# Patient Record
Sex: Female | Born: 1955 | Race: White | Hispanic: No | Marital: Married | Smoking: Former smoker
Health system: Southern US, Community
[De-identification: ages and names within clinical notes are randomized; demographics above are authoritative.]

## PROBLEM LIST (undated history)

## (undated) DIAGNOSIS — A64 Unspecified sexually transmitted disease: Secondary | ICD-10-CM

## (undated) DIAGNOSIS — M797 Fibromyalgia: Secondary | ICD-10-CM

## (undated) DIAGNOSIS — IMO0001 Reserved for inherently not codable concepts without codable children: Secondary | ICD-10-CM

## (undated) DIAGNOSIS — K589 Irritable bowel syndrome without diarrhea: Secondary | ICD-10-CM

## (undated) DIAGNOSIS — N871 Moderate cervical dysplasia: Secondary | ICD-10-CM

## (undated) DIAGNOSIS — I10 Essential (primary) hypertension: Secondary | ICD-10-CM

## (undated) DIAGNOSIS — C20 Malignant neoplasm of rectum: Secondary | ICD-10-CM

## (undated) DIAGNOSIS — F329 Major depressive disorder, single episode, unspecified: Secondary | ICD-10-CM

## (undated) DIAGNOSIS — K219 Gastro-esophageal reflux disease without esophagitis: Secondary | ICD-10-CM

## (undated) DIAGNOSIS — M545 Low back pain, unspecified: Secondary | ICD-10-CM

## (undated) DIAGNOSIS — R51 Headache: Secondary | ICD-10-CM

## (undated) DIAGNOSIS — N951 Menopausal and female climacteric states: Secondary | ICD-10-CM

## (undated) DIAGNOSIS — S0300XA Dislocation of jaw, unspecified side, initial encounter: Secondary | ICD-10-CM

## (undated) DIAGNOSIS — F32A Depression, unspecified: Secondary | ICD-10-CM

## (undated) DIAGNOSIS — T7840XA Allergy, unspecified, initial encounter: Secondary | ICD-10-CM

## (undated) DIAGNOSIS — F419 Anxiety disorder, unspecified: Secondary | ICD-10-CM

## (undated) DIAGNOSIS — M1990 Unspecified osteoarthritis, unspecified site: Secondary | ICD-10-CM

## (undated) DIAGNOSIS — I341 Nonrheumatic mitral (valve) prolapse: Secondary | ICD-10-CM

## (undated) DIAGNOSIS — G629 Polyneuropathy, unspecified: Secondary | ICD-10-CM

## (undated) DIAGNOSIS — M899 Disorder of bone, unspecified: Secondary | ICD-10-CM

## (undated) DIAGNOSIS — Z5189 Encounter for other specified aftercare: Secondary | ICD-10-CM

## (undated) HISTORY — DX: Gastro-esophageal reflux disease without esophagitis: K21.9

## (undated) HISTORY — DX: Low back pain: M54.5

## (undated) HISTORY — DX: Dislocation of jaw, unspecified side, initial encounter: S03.00XA

## (undated) HISTORY — PX: COLONOSCOPY: SHX174

## (undated) HISTORY — DX: Fibromyalgia: M79.7

## (undated) HISTORY — DX: Menopausal and female climacteric states: N95.1

## (undated) HISTORY — PX: COLPOSCOPY: SHX161

## (undated) HISTORY — PX: HYSTEROSCOPY: SHX211

## (undated) HISTORY — DX: Disorder of bone, unspecified: M89.9

## (undated) HISTORY — DX: Encounter for other specified aftercare: Z51.89

## (undated) HISTORY — DX: Moderate cervical dysplasia: N87.1

## (undated) HISTORY — DX: Polyneuropathy, unspecified: G62.9

## (undated) HISTORY — PX: OTHER SURGICAL HISTORY: SHX169

## (undated) HISTORY — DX: Headache: R51

## (undated) HISTORY — DX: Reserved for inherently not codable concepts without codable children: IMO0001

## (undated) HISTORY — DX: Nonrheumatic mitral (valve) prolapse: I34.1

## (undated) HISTORY — DX: Malignant neoplasm of rectum: C20

## (undated) HISTORY — DX: Allergy, unspecified, initial encounter: T78.40XA

## (undated) HISTORY — PX: UPPER GASTROINTESTINAL ENDOSCOPY: SHX188

## (undated) HISTORY — DX: Irritable bowel syndrome, unspecified: K58.9

## (undated) HISTORY — DX: Anxiety disorder, unspecified: F41.9

## (undated) HISTORY — DX: Unspecified osteoarthritis, unspecified site: M19.90

## (undated) HISTORY — PX: BUNIONECTOMY: SHX129

## (undated) HISTORY — DX: Depression, unspecified: F32.A

## (undated) HISTORY — DX: Unspecified sexually transmitted disease: A64

## (undated) HISTORY — DX: Essential (primary) hypertension: I10

## (undated) HISTORY — DX: Low back pain, unspecified: M54.50

## (undated) HISTORY — DX: Major depressive disorder, single episode, unspecified: F32.9

---

## 1998-07-07 ENCOUNTER — Other Ambulatory Visit: Admission: RE | Admit: 1998-07-07 | Discharge: 1998-07-07 | Payer: Self-pay | Admitting: Obstetrics and Gynecology

## 1998-09-15 ENCOUNTER — Ambulatory Visit: Admission: RE | Admit: 1998-09-15 | Discharge: 1998-09-15 | Payer: Self-pay | Admitting: Family Medicine

## 1999-08-05 ENCOUNTER — Other Ambulatory Visit: Admission: RE | Admit: 1999-08-05 | Discharge: 1999-08-05 | Payer: Self-pay | Admitting: Obstetrics and Gynecology

## 1999-11-23 ENCOUNTER — Encounter: Payer: Self-pay | Admitting: Obstetrics and Gynecology

## 1999-11-23 ENCOUNTER — Encounter: Admission: RE | Admit: 1999-11-23 | Discharge: 1999-11-23 | Payer: Self-pay | Admitting: Obstetrics and Gynecology

## 2000-08-07 ENCOUNTER — Other Ambulatory Visit: Admission: RE | Admit: 2000-08-07 | Discharge: 2000-08-07 | Payer: Self-pay | Admitting: Obstetrics and Gynecology

## 2001-02-28 ENCOUNTER — Encounter: Admission: RE | Admit: 2001-02-28 | Discharge: 2001-02-28 | Payer: Self-pay | Admitting: Obstetrics and Gynecology

## 2001-02-28 ENCOUNTER — Encounter: Payer: Self-pay | Admitting: Obstetrics and Gynecology

## 2001-08-14 ENCOUNTER — Other Ambulatory Visit: Admission: RE | Admit: 2001-08-14 | Discharge: 2001-08-14 | Payer: Self-pay | Admitting: Obstetrics and Gynecology

## 2002-09-20 ENCOUNTER — Other Ambulatory Visit: Admission: RE | Admit: 2002-09-20 | Discharge: 2002-09-20 | Payer: Self-pay | Admitting: Obstetrics and Gynecology

## 2002-09-27 ENCOUNTER — Encounter: Payer: Self-pay | Admitting: Family Medicine

## 2002-09-27 ENCOUNTER — Encounter (INDEPENDENT_AMBULATORY_CARE_PROVIDER_SITE_OTHER): Payer: Self-pay | Admitting: *Deleted

## 2002-09-27 ENCOUNTER — Encounter: Admission: RE | Admit: 2002-09-27 | Discharge: 2002-09-27 | Payer: Self-pay | Admitting: Family Medicine

## 2002-11-26 ENCOUNTER — Encounter: Payer: Self-pay | Admitting: Obstetrics and Gynecology

## 2002-11-26 ENCOUNTER — Encounter: Admission: RE | Admit: 2002-11-26 | Discharge: 2002-11-26 | Payer: Self-pay | Admitting: Obstetrics and Gynecology

## 2003-01-06 ENCOUNTER — Encounter: Payer: Self-pay | Admitting: Internal Medicine

## 2003-01-06 LAB — HM COLONOSCOPY

## 2003-04-23 ENCOUNTER — Encounter: Admission: RE | Admit: 2003-04-23 | Discharge: 2003-04-23 | Payer: Self-pay | Admitting: Family Medicine

## 2003-09-22 ENCOUNTER — Other Ambulatory Visit: Admission: RE | Admit: 2003-09-22 | Discharge: 2003-09-22 | Payer: Self-pay | Admitting: Obstetrics and Gynecology

## 2004-02-24 ENCOUNTER — Encounter: Admission: RE | Admit: 2004-02-24 | Discharge: 2004-02-24 | Payer: Self-pay | Admitting: Obstetrics and Gynecology

## 2004-09-03 ENCOUNTER — Ambulatory Visit: Payer: Self-pay | Admitting: Family Medicine

## 2004-09-23 ENCOUNTER — Other Ambulatory Visit: Admission: RE | Admit: 2004-09-23 | Discharge: 2004-09-23 | Payer: Self-pay | Admitting: Obstetrics and Gynecology

## 2004-11-02 ENCOUNTER — Ambulatory Visit: Payer: Self-pay | Admitting: Family Medicine

## 2004-11-09 ENCOUNTER — Ambulatory Visit: Payer: Self-pay | Admitting: Family Medicine

## 2005-09-26 ENCOUNTER — Other Ambulatory Visit: Admission: RE | Admit: 2005-09-26 | Discharge: 2005-09-26 | Payer: Self-pay | Admitting: Obstetrics and Gynecology

## 2005-10-04 ENCOUNTER — Encounter: Admission: RE | Admit: 2005-10-04 | Discharge: 2005-10-04 | Payer: Self-pay | Admitting: Obstetrics and Gynecology

## 2006-01-19 ENCOUNTER — Ambulatory Visit: Payer: Self-pay | Admitting: Family Medicine

## 2006-01-23 ENCOUNTER — Ambulatory Visit: Payer: Self-pay | Admitting: Family Medicine

## 2006-07-25 ENCOUNTER — Encounter: Admission: RE | Admit: 2006-07-25 | Discharge: 2006-07-25 | Payer: Self-pay | Admitting: Rheumatology

## 2006-08-17 ENCOUNTER — Encounter: Admission: RE | Admit: 2006-08-17 | Discharge: 2006-08-17 | Payer: Self-pay | Admitting: Neurosurgery

## 2006-09-28 ENCOUNTER — Other Ambulatory Visit: Admission: RE | Admit: 2006-09-28 | Discharge: 2006-09-28 | Payer: Self-pay | Admitting: Obstetrics and Gynecology

## 2006-10-31 ENCOUNTER — Encounter: Admission: RE | Admit: 2006-10-31 | Discharge: 2006-10-31 | Payer: Self-pay | Admitting: Obstetrics and Gynecology

## 2007-02-26 ENCOUNTER — Ambulatory Visit: Payer: Self-pay | Admitting: Family Medicine

## 2007-02-26 LAB — CONVERTED CEMR LAB
ALT: 18 units/L (ref 0–35)
AST: 19 units/L (ref 0–37)
Albumin: 3.7 g/dL (ref 3.5–5.2)
Alkaline Phosphatase: 25 units/L — ABNORMAL LOW (ref 39–117)
BUN: 9 mg/dL (ref 6–23)
Basophils Absolute: 0.1 10*3/uL (ref 0.0–0.1)
Blood in Urine, dipstick: NEGATIVE
Calcium: 8.7 mg/dL (ref 8.4–10.5)
Chloride: 105 meq/L (ref 96–112)
Direct LDL: 106.5 mg/dL
Eosinophils Absolute: 0.2 10*3/uL (ref 0.0–0.6)
GFR calc non Af Amer: 113 mL/min
Glucose, Urine, Semiquant: NEGATIVE
HDL: 66.8 mg/dL (ref >39.0)
Ketones, urine, test strip: NEGATIVE
MCHC: 33.6 g/dL (ref 30.0–36.0)
MCV: 90.4 fL (ref 78.0–100.0)
Monocytes Relative: 6.5 % (ref 3.0–11.0)
Platelets: 249 10*3/uL (ref 150–400)
RBC: 3.78 M/uL — ABNORMAL LOW (ref 3.87–5.11)
RDW: 11.8 % (ref 11.5–14.6)
Sodium: 141 meq/L (ref 135–145)
Specific Gravity, Urine: 1.015
Total CHOL/HDL Ratio: 3
Triglycerides: 131 mg/dL (ref 0–149)
pH: 8.5

## 2007-03-05 ENCOUNTER — Ambulatory Visit: Payer: Self-pay | Admitting: Family Medicine

## 2007-03-05 DIAGNOSIS — R51 Headache: Secondary | ICD-10-CM | POA: Insufficient documentation

## 2007-03-05 DIAGNOSIS — IMO0001 Reserved for inherently not codable concepts without codable children: Secondary | ICD-10-CM | POA: Insufficient documentation

## 2007-03-05 DIAGNOSIS — M545 Low back pain: Secondary | ICD-10-CM

## 2007-03-05 DIAGNOSIS — I10 Essential (primary) hypertension: Secondary | ICD-10-CM | POA: Insufficient documentation

## 2007-03-05 DIAGNOSIS — F329 Major depressive disorder, single episode, unspecified: Secondary | ICD-10-CM

## 2007-03-05 DIAGNOSIS — R519 Headache, unspecified: Secondary | ICD-10-CM | POA: Insufficient documentation

## 2007-03-05 DIAGNOSIS — K219 Gastro-esophageal reflux disease without esophagitis: Secondary | ICD-10-CM

## 2007-03-05 DIAGNOSIS — J309 Allergic rhinitis, unspecified: Secondary | ICD-10-CM | POA: Insufficient documentation

## 2007-07-19 ENCOUNTER — Telehealth (INDEPENDENT_AMBULATORY_CARE_PROVIDER_SITE_OTHER): Payer: Self-pay | Admitting: *Deleted

## 2007-08-07 ENCOUNTER — Ambulatory Visit: Payer: Self-pay | Admitting: Internal Medicine

## 2007-08-21 ENCOUNTER — Ambulatory Visit: Payer: Self-pay | Admitting: Internal Medicine

## 2007-08-21 ENCOUNTER — Encounter: Payer: Self-pay | Admitting: Internal Medicine

## 2007-08-21 ENCOUNTER — Encounter: Payer: Self-pay | Admitting: Family Medicine

## 2007-09-27 ENCOUNTER — Encounter: Payer: Self-pay | Admitting: Internal Medicine

## 2007-10-01 ENCOUNTER — Other Ambulatory Visit: Admission: RE | Admit: 2007-10-01 | Discharge: 2007-10-01 | Payer: Self-pay | Admitting: Obstetrics and Gynecology

## 2007-11-01 ENCOUNTER — Encounter: Admission: RE | Admit: 2007-11-01 | Discharge: 2007-11-01 | Payer: Self-pay | Admitting: Obstetrics and Gynecology

## 2007-11-15 ENCOUNTER — Encounter (INDEPENDENT_AMBULATORY_CARE_PROVIDER_SITE_OTHER): Payer: Self-pay | Admitting: Surgery

## 2007-11-15 ENCOUNTER — Ambulatory Visit (HOSPITAL_COMMUNITY): Admission: RE | Admit: 2007-11-15 | Discharge: 2007-11-15 | Payer: Self-pay | Admitting: Surgery

## 2007-11-15 ENCOUNTER — Encounter (INDEPENDENT_AMBULATORY_CARE_PROVIDER_SITE_OTHER): Payer: Self-pay | Admitting: *Deleted

## 2007-11-23 ENCOUNTER — Ambulatory Visit: Payer: Self-pay | Admitting: Oncology

## 2007-11-26 ENCOUNTER — Telehealth: Payer: Self-pay | Admitting: Internal Medicine

## 2007-11-28 ENCOUNTER — Encounter: Payer: Self-pay | Admitting: Internal Medicine

## 2007-11-28 LAB — CBC WITH DIFFERENTIAL/PLATELET
BASO%: 0.9 % (ref 0.0–2.0)
EOS%: 1.8 % (ref 0.0–7.0)
HCT: 34.7 % — ABNORMAL LOW (ref 34.8–46.6)
LYMPH%: 35.1 % (ref 14.0–48.0)
MCH: 30.1 pg (ref 26.0–34.0)
MCHC: 35.4 g/dL (ref 32.0–36.0)
MONO#: 0.3 10*3/uL (ref 0.1–0.9)
NEUT%: 56.5 % (ref 39.6–76.8)
Platelets: 324 10*3/uL (ref 145–400)
RBC: 4.08 10*6/uL (ref 3.70–5.32)
WBC: 6.2 10*3/uL (ref 3.9–10.0)

## 2007-11-28 LAB — COMPREHENSIVE METABOLIC PANEL
AST: 12 U/L (ref 0–37)
Alkaline Phosphatase: 32 U/L — ABNORMAL LOW (ref 39–117)
BUN: 13 mg/dL (ref 6–23)
Creatinine, Ser: 0.89 mg/dL (ref 0.40–1.20)

## 2007-12-03 ENCOUNTER — Ambulatory Visit: Admission: RE | Admit: 2007-12-03 | Discharge: 2008-01-28 | Payer: Self-pay | Admitting: Radiation Oncology

## 2007-12-04 ENCOUNTER — Ambulatory Visit (HOSPITAL_COMMUNITY): Admission: RE | Admit: 2007-12-04 | Discharge: 2007-12-04 | Payer: Self-pay | Admitting: Oncology

## 2007-12-04 ENCOUNTER — Encounter (INDEPENDENT_AMBULATORY_CARE_PROVIDER_SITE_OTHER): Payer: Self-pay | Admitting: *Deleted

## 2007-12-05 ENCOUNTER — Encounter: Payer: Self-pay | Admitting: Internal Medicine

## 2007-12-07 ENCOUNTER — Encounter (INDEPENDENT_AMBULATORY_CARE_PROVIDER_SITE_OTHER): Payer: Self-pay | Admitting: Diagnostic Radiology

## 2007-12-07 ENCOUNTER — Ambulatory Visit (HOSPITAL_COMMUNITY): Admission: RE | Admit: 2007-12-07 | Discharge: 2007-12-07 | Payer: Self-pay | Admitting: Oncology

## 2007-12-07 ENCOUNTER — Encounter (INDEPENDENT_AMBULATORY_CARE_PROVIDER_SITE_OTHER): Payer: Self-pay | Admitting: *Deleted

## 2008-03-11 ENCOUNTER — Encounter: Payer: Self-pay | Admitting: Cardiology

## 2008-03-20 ENCOUNTER — Ambulatory Visit: Payer: Self-pay | Admitting: Oncology

## 2008-03-20 LAB — CBC WITH DIFFERENTIAL/PLATELET
BASO%: 0.2 % (ref 0.0–2.0)
EOS%: 4.7 % (ref 0.0–7.0)
MCH: 31.7 pg (ref 26.0–34.0)
MCHC: 35 g/dL (ref 32.0–36.0)
MONO#: 0.4 10*3/uL (ref 0.1–0.9)
RBC: 3.35 10*6/uL — ABNORMAL LOW (ref 3.70–5.32)
WBC: 4.1 10*3/uL (ref 3.9–10.0)
lymph#: 1.1 10*3/uL (ref 0.9–3.3)

## 2008-03-20 LAB — COMPREHENSIVE METABOLIC PANEL
ALT: 24 U/L (ref 0–35)
AST: 16 U/L (ref 0–37)
CO2: 23 mEq/L (ref 19–32)
Calcium: 8.3 mg/dL — ABNORMAL LOW (ref 8.4–10.5)
Chloride: 108 mEq/L (ref 96–112)
Creatinine, Ser: 0.7 mg/dL (ref 0.40–1.20)
Sodium: 141 mEq/L (ref 135–145)
Total Bilirubin: 0.3 mg/dL (ref 0.3–1.2)
Total Protein: 5.7 g/dL — ABNORMAL LOW (ref 6.0–8.3)

## 2008-04-01 ENCOUNTER — Telehealth: Payer: Self-pay | Admitting: Family Medicine

## 2008-05-12 ENCOUNTER — Ambulatory Visit: Payer: Self-pay | Admitting: Family Medicine

## 2008-05-12 LAB — CONVERTED CEMR LAB
Basophils Absolute: 0 10*3/uL (ref 0.0–0.1)
Bilirubin Urine: NEGATIVE
Bilirubin, Direct: 0.1 mg/dL (ref 0.0–0.3)
Blood in Urine, dipstick: NEGATIVE
Calcium: 8.7 mg/dL (ref 8.4–10.5)
Cholesterol: 194 mg/dL (ref 0–200)
GFR calc Af Amer: 114 mL/min
HCT: 36.8 % (ref 36.0–46.0)
Hemoglobin: 12.9 g/dL (ref 12.0–15.0)
Ketones, urine, test strip: NEGATIVE
LDL Cholesterol: 122 mg/dL — ABNORMAL HIGH (ref 0–99)
MCHC: 35 g/dL (ref 30.0–36.0)
Monocytes Absolute: 0.3 10*3/uL (ref 0.1–1.0)
Neutro Abs: 2.5 10*3/uL (ref 1.4–7.7)
RDW: 10.9 % — ABNORMAL LOW (ref 11.5–14.6)
Sodium: 142 meq/L (ref 135–145)
Total Bilirubin: 0.6 mg/dL (ref 0.3–1.2)
Triglycerides: 84 mg/dL (ref 0–149)
Urobilinogen, UA: 0.2

## 2008-05-16 HISTORY — PX: CERVICAL CONE BIOPSY: SUR198

## 2008-05-22 ENCOUNTER — Ambulatory Visit: Payer: Self-pay | Admitting: Family Medicine

## 2008-05-22 DIAGNOSIS — C20 Malignant neoplasm of rectum: Secondary | ICD-10-CM | POA: Insufficient documentation

## 2008-06-12 ENCOUNTER — Telehealth: Payer: Self-pay | Admitting: *Deleted

## 2008-07-15 ENCOUNTER — Telehealth: Payer: Self-pay | Admitting: Family Medicine

## 2008-07-16 ENCOUNTER — Ambulatory Visit: Payer: Self-pay | Admitting: Family Medicine

## 2008-07-16 DIAGNOSIS — M26609 Unspecified temporomandibular joint disorder, unspecified side: Secondary | ICD-10-CM | POA: Insufficient documentation

## 2008-10-02 ENCOUNTER — Ambulatory Visit: Payer: Self-pay | Admitting: Obstetrics and Gynecology

## 2008-10-02 ENCOUNTER — Encounter: Payer: Self-pay | Admitting: Obstetrics and Gynecology

## 2008-10-02 ENCOUNTER — Other Ambulatory Visit: Admission: RE | Admit: 2008-10-02 | Discharge: 2008-10-02 | Payer: Self-pay | Admitting: Obstetrics and Gynecology

## 2008-10-07 ENCOUNTER — Encounter: Payer: Self-pay | Admitting: Internal Medicine

## 2008-11-06 ENCOUNTER — Encounter: Admission: RE | Admit: 2008-11-06 | Discharge: 2008-11-06 | Payer: Self-pay | Admitting: Obstetrics and Gynecology

## 2008-11-10 ENCOUNTER — Encounter (INDEPENDENT_AMBULATORY_CARE_PROVIDER_SITE_OTHER): Payer: Self-pay | Admitting: *Deleted

## 2008-11-10 ENCOUNTER — Ambulatory Visit: Payer: Self-pay | Admitting: Obstetrics and Gynecology

## 2008-11-12 ENCOUNTER — Telehealth: Payer: Self-pay | Admitting: Internal Medicine

## 2008-11-13 DIAGNOSIS — K209 Esophagitis, unspecified without bleeding: Secondary | ICD-10-CM | POA: Insufficient documentation

## 2008-11-13 DIAGNOSIS — M1990 Unspecified osteoarthritis, unspecified site: Secondary | ICD-10-CM | POA: Insufficient documentation

## 2008-11-13 DIAGNOSIS — Z860100 Personal history of colon polyps, unspecified: Secondary | ICD-10-CM | POA: Insufficient documentation

## 2008-11-13 DIAGNOSIS — K649 Unspecified hemorrhoids: Secondary | ICD-10-CM | POA: Insufficient documentation

## 2008-11-13 DIAGNOSIS — Z87898 Personal history of other specified conditions: Secondary | ICD-10-CM

## 2008-11-13 DIAGNOSIS — C210 Malignant neoplasm of anus, unspecified: Secondary | ICD-10-CM | POA: Insufficient documentation

## 2008-11-18 ENCOUNTER — Ambulatory Visit: Payer: Self-pay | Admitting: Internal Medicine

## 2008-11-18 DIAGNOSIS — R197 Diarrhea, unspecified: Secondary | ICD-10-CM

## 2008-11-20 LAB — CONVERTED CEMR LAB: Tissue Transglutaminase Ab, IgA: 0 units (ref <7)

## 2008-11-24 ENCOUNTER — Ambulatory Visit: Payer: Self-pay | Admitting: Obstetrics and Gynecology

## 2008-11-24 ENCOUNTER — Encounter: Payer: Self-pay | Admitting: Obstetrics and Gynecology

## 2008-12-11 ENCOUNTER — Ambulatory Visit: Payer: Self-pay | Admitting: Obstetrics and Gynecology

## 2008-12-18 ENCOUNTER — Encounter: Payer: Self-pay | Admitting: Obstetrics and Gynecology

## 2008-12-18 ENCOUNTER — Ambulatory Visit (HOSPITAL_BASED_OUTPATIENT_CLINIC_OR_DEPARTMENT_OTHER): Admission: RE | Admit: 2008-12-18 | Discharge: 2008-12-18 | Payer: Self-pay | Admitting: Obstetrics and Gynecology

## 2008-12-19 ENCOUNTER — Telehealth: Payer: Self-pay | Admitting: Family Medicine

## 2009-01-14 ENCOUNTER — Ambulatory Visit: Payer: Self-pay | Admitting: Obstetrics and Gynecology

## 2009-01-15 IMAGING — CT CT BIOPSY
1 series · 15 of 32 positions shown, 19 images · non-contrast
Comparison: none

CLINICAL HISTORY: History of anal cancer with a small PET positive
lymph node in the left external iliac chain.

[Series 2: routine pelvis · axial · 0.70mm/px · z∈[-240,-175]mm · 15 of 128 slices shown, 19 images]
[im 9/128  soft-tissue]
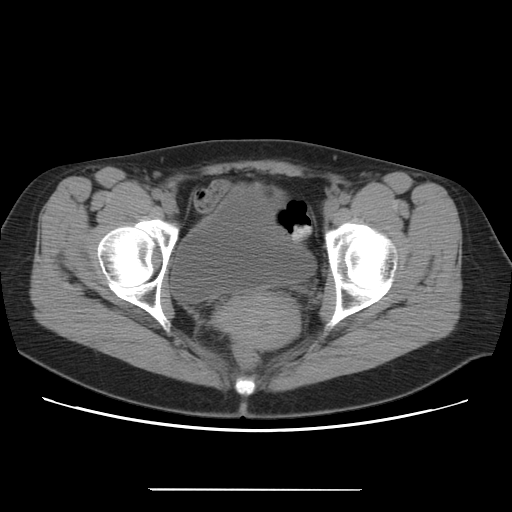
[im 9/128  bone]
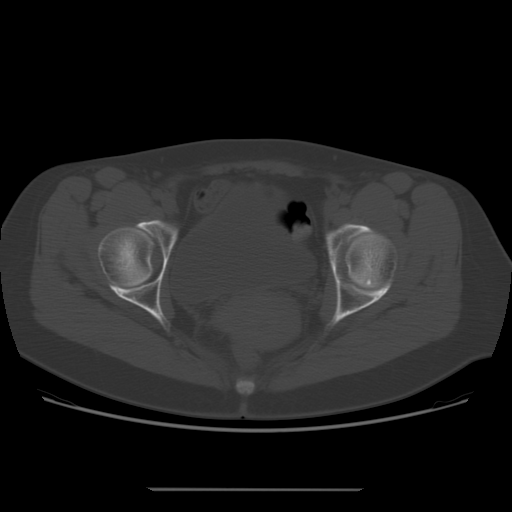
[im 17/128  soft-tissue]
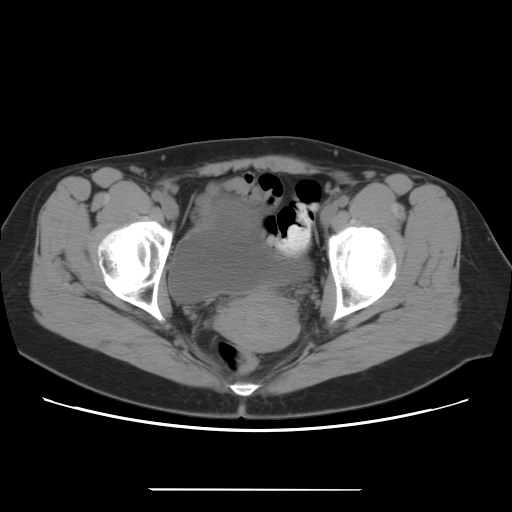
[im 25/128  soft-tissue]
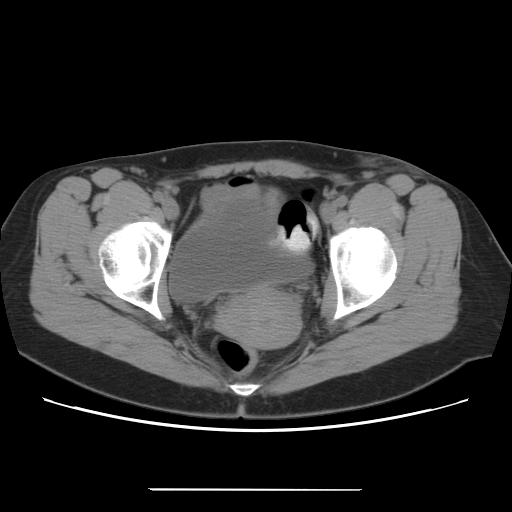
[im 37/128  soft-tissue]
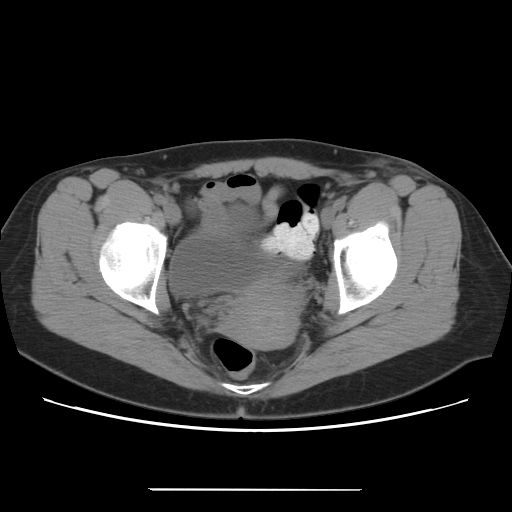
[im 46/128  soft-tissue]
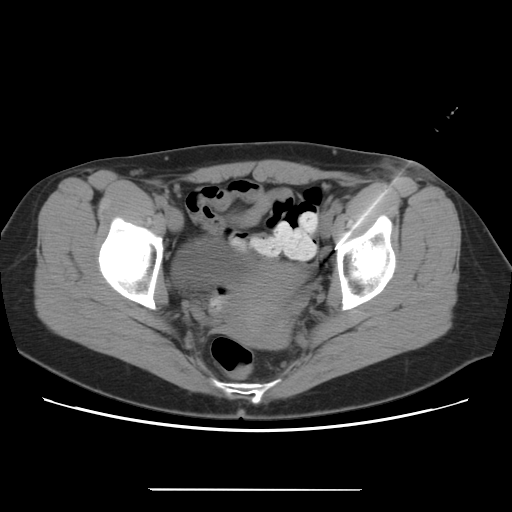
[im 54/128  soft-tissue]
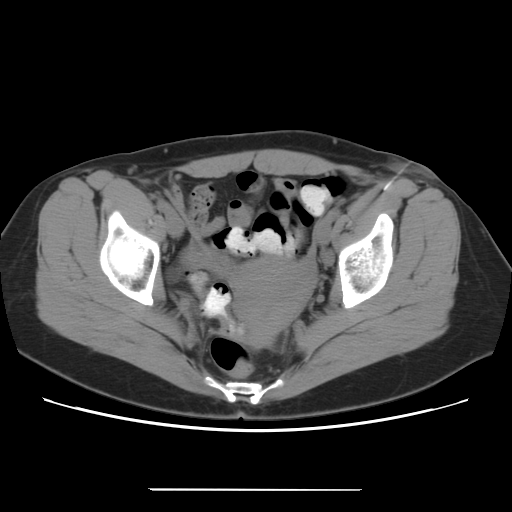
[im 66/128  soft-tissue]
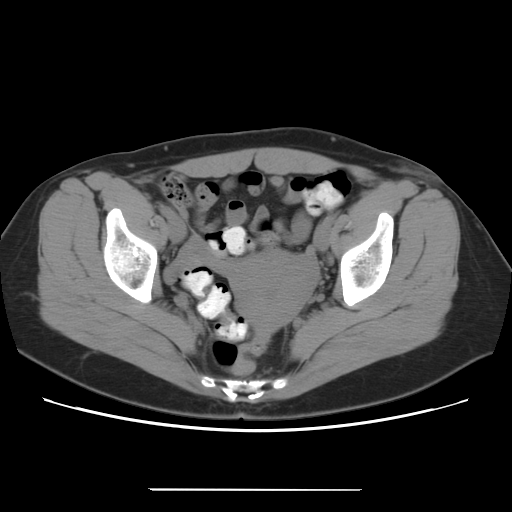
[im 74/128  soft-tissue]
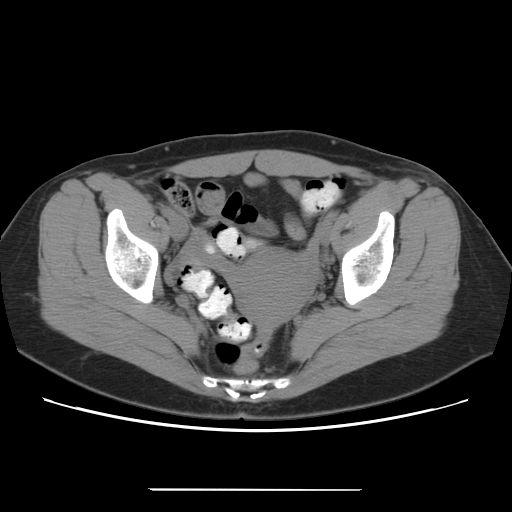
[im 82/128  soft-tissue]
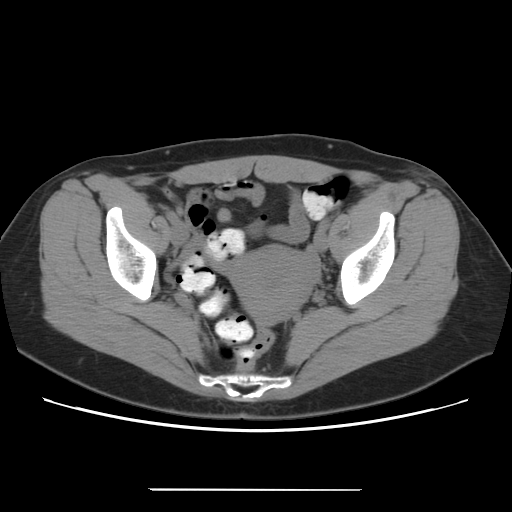
[im 82/128  bone]
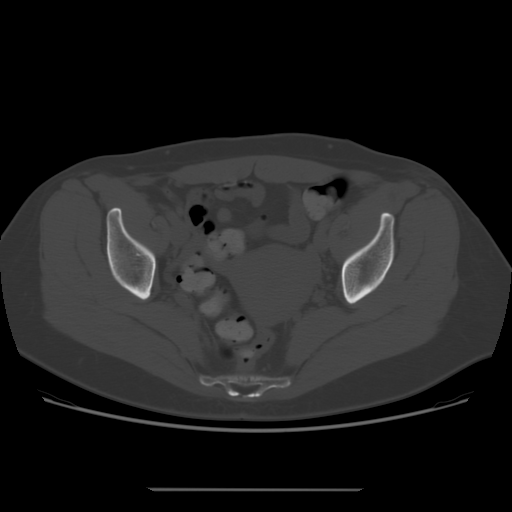
[im 91/128  soft-tissue]
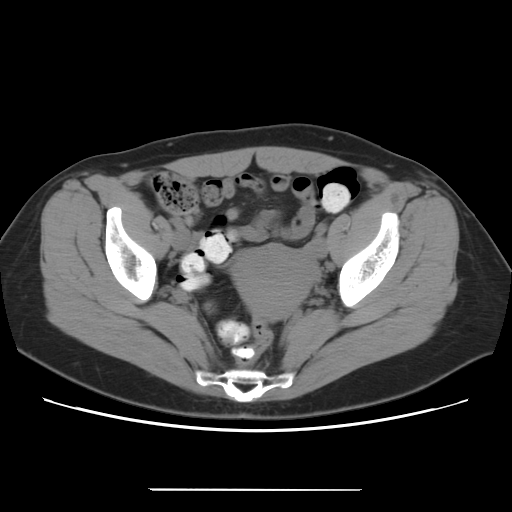
[im 103/128  soft-tissue]
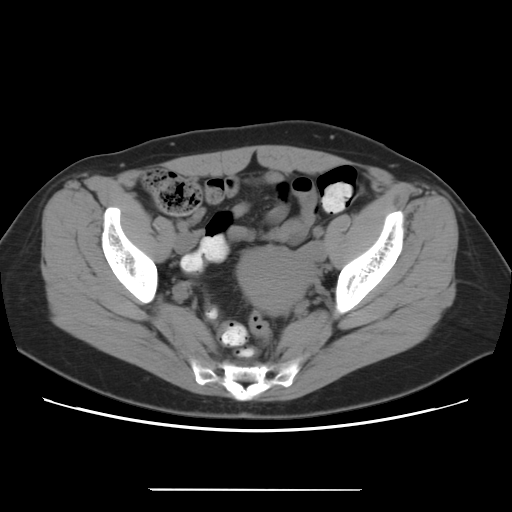
[im 103/128  lung]
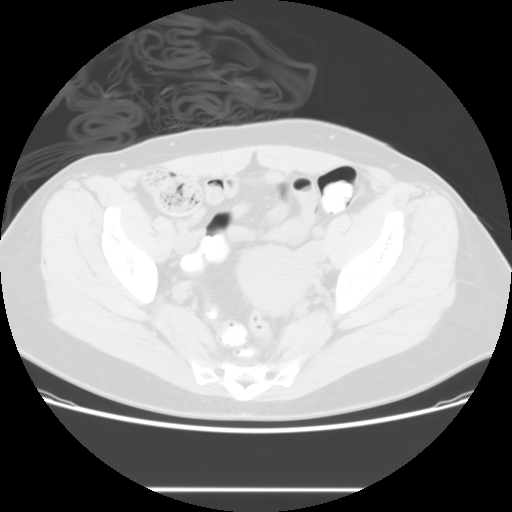
[im 111/128  soft-tissue]
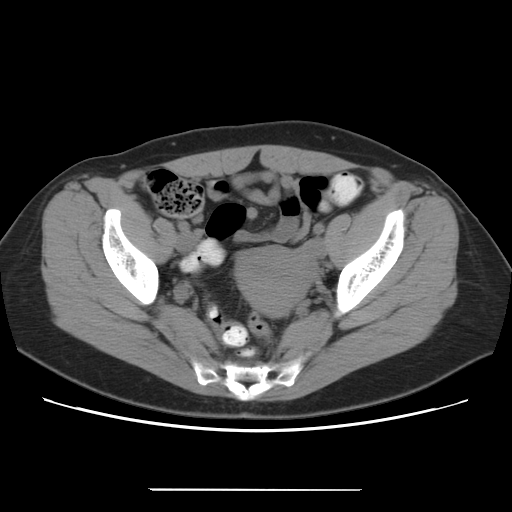
[im 115/128  lung]
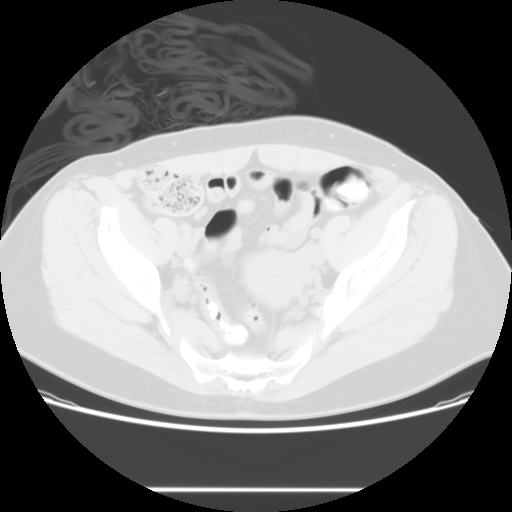
[im 119/128  soft-tissue]
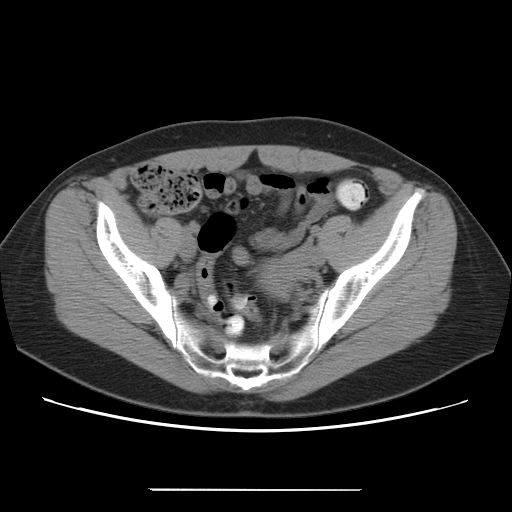
[im 119/128  lung]
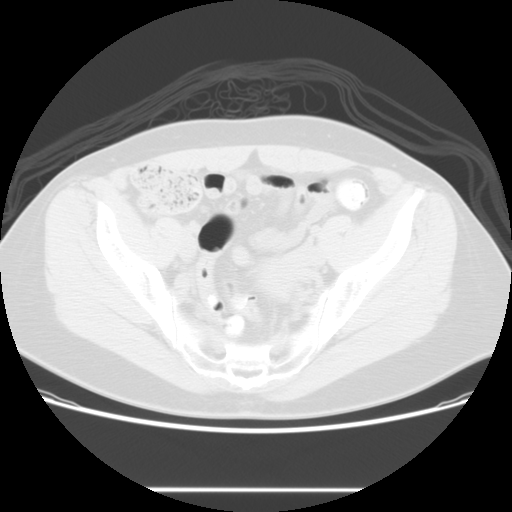
[im 123/128  lung]
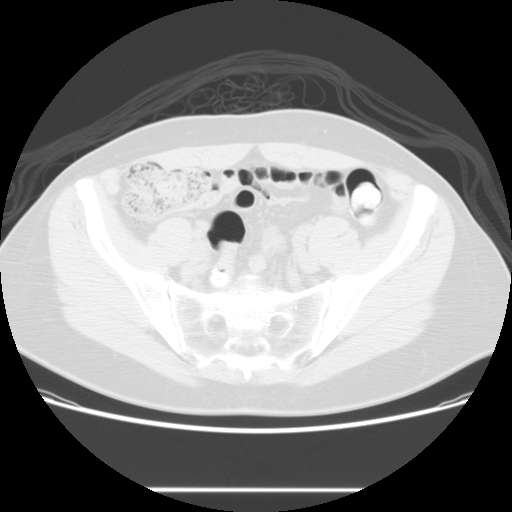

[15 of 32 positions shown; findings below may reference images not displayed]

PROCEDURE(S): CT GUIDED FINE NEEDLE ASPIRATION OF THE LEFT PELVIC
LYMPH NODE.

Medications:Versed 3 mg, Fentanyl 150 mcg

Sedation time:50 minutes

Procedure:Informed consent was obtained for a CT guided left pelvic
biopsy.  The patient was placed in a supine position and CT images
were obtained of the pelvis.  The lymph node of concern was
identified in the left external iliac chain.  The left inguinal
region was prepped with Betadine and a sterile drape was placed.
The skin and subcutaneous tissues were anesthetized with 1%
lidocaine.  An 19 gauge needle was directed through the left
iliopsoas muscle.  Four fine needle aspirations were obtained and
sent for quick stains.  The pathologist (Dr. Hul) requested
additional samples for cell block.  Three additional fine needle
aspirations were obtained for a cell block.  Needle was removed
without complication.
FINDINGS: The patient has a 1.4 x 0.9 cm lymph node along the left
pelvic sidewall in the region of the left external iliac chain.
This node represents an area of increased metabolism on the PET
imaging from 12/04/2007.  Needle was placed through the left
iliopsoas muscle and fine needle aspirations were placed within the
lesion.  There is no evidence for hemorrhage following the biopsy.
IMPRESSION: CT guided fine needle aspirations of the left pelvic
lymph node.

## 2009-04-24 ENCOUNTER — Ambulatory Visit: Payer: Self-pay | Admitting: Family Medicine

## 2009-04-24 DIAGNOSIS — J0110 Acute frontal sinusitis, unspecified: Secondary | ICD-10-CM

## 2009-04-29 ENCOUNTER — Ambulatory Visit: Payer: Self-pay | Admitting: Family Medicine

## 2009-05-01 ENCOUNTER — Telehealth: Payer: Self-pay | Admitting: Family Medicine

## 2009-05-04 ENCOUNTER — Telehealth: Payer: Self-pay | Admitting: Family Medicine

## 2009-05-04 DIAGNOSIS — D239 Other benign neoplasm of skin, unspecified: Secondary | ICD-10-CM | POA: Insufficient documentation

## 2009-05-04 DIAGNOSIS — L570 Actinic keratosis: Secondary | ICD-10-CM

## 2009-05-16 HISTORY — PX: LUNG SURGERY: SHX703

## 2009-06-01 ENCOUNTER — Ambulatory Visit: Payer: Self-pay | Admitting: Family Medicine

## 2009-06-01 ENCOUNTER — Telehealth: Payer: Self-pay | Admitting: Family Medicine

## 2009-06-17 ENCOUNTER — Telehealth: Payer: Self-pay | Admitting: Family Medicine

## 2009-11-02 ENCOUNTER — Telehealth: Payer: Self-pay | Admitting: Family Medicine

## 2010-01-08 ENCOUNTER — Other Ambulatory Visit: Admission: RE | Admit: 2010-01-08 | Discharge: 2010-01-08 | Payer: Self-pay | Admitting: Obstetrics and Gynecology

## 2010-01-08 ENCOUNTER — Ambulatory Visit: Payer: Self-pay | Admitting: Obstetrics and Gynecology

## 2010-01-15 ENCOUNTER — Emergency Department (HOSPITAL_COMMUNITY): Admission: EM | Admit: 2010-01-15 | Discharge: 2010-01-15 | Payer: Self-pay | Admitting: Emergency Medicine

## 2010-02-19 ENCOUNTER — Encounter: Admission: RE | Admit: 2010-02-19 | Discharge: 2010-02-19 | Payer: Self-pay | Admitting: Obstetrics and Gynecology

## 2010-02-25 ENCOUNTER — Ambulatory Visit: Payer: Self-pay | Admitting: Obstetrics and Gynecology

## 2010-06-07 ENCOUNTER — Encounter (HOSPITAL_COMMUNITY): Payer: Self-pay | Admitting: Oncology

## 2010-06-07 ENCOUNTER — Encounter: Payer: Self-pay | Admitting: Obstetrics and Gynecology

## 2010-06-15 NOTE — Progress Notes (Signed)
Summary: Pt called req script of Xyzal to replace Fexofendine 280mg .  Phone Note Call from Patient Call back at [facility] Phone [phone]   Caller: Patient Summary of Call: Pt called req script of Xyzal to replace Fexofendine 280mg . Pt say Zyzal is covered by her insurance and is an alternative.  Please call in to [facility] Phone #[phone]  RX Bin # [phone] Initial call taken by: Lucy Antigua,  November 02, 2009 10:49 AM  Follow-up for Phone Call        recommend OTC Claritin Follow-up by: Roderick Pee MD,  November 02, 2009 11:34 AM

## 2010-06-15 NOTE — Progress Notes (Signed)
Summary: non healing mole removal area  Phone Note Call from Patient Call back at Home Phone [phone] Call back at Work Phone [phone] Call back at H after 4:30   Caller: vm Call For: Shaima Sardinas Summary of Call: Mole removal 2 months ago.  One not healing.  Red around.  Needs to be cleared up before some other surgery 2-7.  Med to use on area to [facility]. @9 :07 Rudy Jew, RN  June 01, 2009 9:42 AM Left message to call back & schedule appointment with Dr. Tawanna Cooler. Rudy Jew, RN  June 01, 2009 9:42 AM  Initial call taken by: Rudy Jew, RN,  June 01, 2009 9:42 AM  Follow-up for Phone Call        come in today ..........anytime..just walk in  Follow-up by: Roderick Pee MD,  June 01, 2009 9:47 AM  Additional Follow-up for Phone Call Additional follow up Details #1::        patient coming in this afternoon Additional Follow-up by: Kern Reap CMA Duncan Dull),  June 01, 2009 9:56 AM

## 2010-06-15 NOTE — Progress Notes (Signed)
Summary: REQ FOR REFILL ON MEDS  Phone Note Call from Patient   Caller: Patient [phone] Reason for Call: Refill Medication Complaint: Breathing Problems Summary of Call: Pt called to req that a RX for meds:   ALLEGRA 180 MG  TABS (FEXOFENADINE HCL)  NEXIUM 40 MG  CPDR (ESOMEPRAZOLE MAGNESIUM) EFFEXOR XR 150 MG  CP24 (VENLAFAXINE HCL)  ZOLPIDEM TARTRATE 5 MG  TABS (ZOLPIDEM TARTRATE) HYDROCHLOROTHIAZIDE 25 MG  TABS (HYDROCHLOROTHIAZIDE)  Pt req that RX refills for these meds be faxed to [facility] @ [phone].  Pt can be reached at [phone] with any questions or concerns.   Initial call taken by: Debbra Riding,  June 17, 2009 2:26 PM  Follow-up for Phone Call        ok..........Marland Kitchenbut I thought we refilled all her medications if not go ahead and a year supply or until her next physical Follow-up by: Roderick Pee MD,  June 18, 2009 1:42 PM  Additional Follow-up for Phone Call Additional follow up Details #1::        rx sent and faxed Additional Follow-up by: Kern Reap CMA Duncan Dull),  June 19, 2009 5:18 PM    Prescriptions: ZOLPIDEM TARTRATE 5 MG  TABS (ZOLPIDEM TARTRATE) 1/2 TAB  at bedtime  #45 x 6   Entered by:   Kern Reap CMA (AAMA)   Authorized by:   Roderick Pee MD   Signed by:   Kern Reap CMA (AAMA) on 06/19/2009   Method used:   Printed then faxed to ...       [facility]* (mail-order)             ,          Ph: [phone]       Fax: [phone]   RxID:   [id number] ALLEGRA 180 MG  TABS (FEXOFENADINE HCL) PRN  #100 x 3   Entered by:   Kern Reap CMA (AAMA)   Authorized by:   Roderick Pee MD   Signed by:   Kern Reap CMA (AAMA) on 06/19/2009   Method used:   Electronically to        [facility]* (mail-order)             ,          Ph: [phone]       Fax: [phone]   RxID:   [id number] NEXIUM 40 MG  CPDR (ESOMEPRAZOLE MAGNESIUM) 1 once daily  #100 x 3   Entered by:   Kern Reap CMA (AAMA)   Authorized by:   Roderick Pee MD   Signed by:   Kern Reap CMA (AAMA) on 06/19/2009   Method used:   Electronically to        [facility]* (mail-order)             ,          Ph: [phone]       Fax: [phone]   RxID:   [id number] EFFEXOR XR 150 MG  CP24 (VENLAFAXINE HCL) two times a day  #100 x 3   Entered by:   Kern Reap CMA (AAMA)   Authorized by:   Roderick Pee MD   Signed by:   Kern Reap CMA (AAMA) on 06/19/2009   Method used:   Electronically to        [facility]* (mail-order)             ,  Ph: [phone]       Fax: [phone]   RxID:   [id number] HYDROCHLOROTHIAZIDE 25 MG  TABS (HYDROCHLOROTHIAZIDE) 1 once daily  #90 x 3   Entered by:   Kern Reap CMA (AAMA)   Authorized by:   Roderick Pee MD   Signed by:   Kern Reap CMA (AAMA) on 06/19/2009   Method used:   Electronically to        [facility]* (mail-order)             ,          Ph: [phone]       Fax: [phone]   RxID:   [id number]

## 2010-06-15 NOTE — Assessment & Plan Note (Signed)
Summary: recheck mole that won't heal/cjr   Reason for Visit follow up mole removal  History of Present Illness: we removed a lesion in her posterior right calf about 4 weeks ago.  If still not completely healed.  The other lesion removed from her left anterior thigh is completely healed  she has multiple lung lesions.  She is going to have aVATS procedure with removal of the nodules in her right and left long at [facility] in February  Allergies: No Known Drug Allergies  Physical Exam  General:  Well-developed,well-nourished,in no acute distress; alert,appropriate and cooperative throughout examination Skin:  there is an area approximately 4 mm in diameter with normal wound healing.  No pus    Impression & Recommendations:  Problem # 1:  DYSPLASTIC NEVUS (ICD-216.9) Assessment Improved  Orders: No Charge Patient Arrived (NCPA0) (NCPA0)  Complete Medication List: 1)  Hydrochlorothiazide 25 Mg Tabs (Hydrochlorothiazide) .Marland Kitchen.. 1 once daily 2)  Gabapentin 100 Mg Caps (Gabapentin) .Marland Kitchen.. 1 cap three times a day 3)  Estradiol 1 Mg Tabs (Estradiol) .... One tablet by mouth once daily 4)  Vit D 50,000  .... Once daily 5)  Zolpidem Tartrate 5 Mg Tabs (Zolpidem tartrate) .... 1/2 tab  at bedtime 6)  Effexor Xr 150 Mg Cp24 (Venlafaxine hcl) .... Two times a day 7)  Provigil 200 Mg Tabs (Modafinil) .Marland Kitchen.. 1 once daily 8)  Nexium 40 Mg Cpdr (Esomeprazole magnesium) .Marland Kitchen.. 1 once daily 9)  Allegra 180 Mg Tabs (Fexofenadine hcl) .... Prn 10)  Hyoscyamine Sulfate 0.125 Mg Subl (Hyoscyamine sulfate) .... Put one under your tongue every 4 hours as needed for abd. pain/cramping 11)  Trazamine 50 Mg Misc (Trazodone & diet manage prod) .... 1/2 tablet by mouth once daily 12)  Bentyl 20 Mg Tabs (Dicyclomine hcl) .... Take 1 tablet by mouth two times a day 13)  Prilosec 20 Mg Cpdr (Omeprazole) .... Take 1 tablet by mouth at bedtime. 14)  Amoxicillin 500 Mg Caps (Amoxicillin) .... Take 1 tablet by mouth three  times a day 15)  Hydromet 5-1.5 Mg/69ml Syrp (Hydrocodone-homatropine) .Marland Kitchen.. 1 or 2 tsps at bedtime as needed  Patient Instructions: 1)  stop the cortisone cream. 2)  Apply some antibiotic ointment with a Band-Aid in the morning.  Remove the Band-Aid that time period of the next 4 to 6 weeks.  This should heal if it does not heal.  Let us know

## 2010-07-01 ENCOUNTER — Other Ambulatory Visit: Payer: Self-pay | Admitting: Obstetrics and Gynecology

## 2010-07-01 ENCOUNTER — Other Ambulatory Visit (HOSPITAL_COMMUNITY)
Admission: RE | Admit: 2010-07-01 | Discharge: 2010-07-01 | Disposition: A | Discharge status: Home / Self Care | Payer: 59 | Source: Ambulatory Visit | Attending: Obstetrics and Gynecology | Admitting: Obstetrics and Gynecology

## 2010-07-01 ENCOUNTER — Ambulatory Visit (INDEPENDENT_AMBULATORY_CARE_PROVIDER_SITE_OTHER): Payer: 59 | Admitting: Obstetrics and Gynecology

## 2010-07-01 DIAGNOSIS — Z1159 Encounter for screening for other viral diseases: Secondary | ICD-10-CM | POA: Insufficient documentation

## 2010-07-01 DIAGNOSIS — R87613 High grade squamous intraepithelial lesion on cytologic smear of cervix (HGSIL): Secondary | ICD-10-CM

## 2010-07-01 DIAGNOSIS — M810 Age-related osteoporosis without current pathological fracture: Secondary | ICD-10-CM

## 2010-07-01 DIAGNOSIS — R87619 Unspecified abnormal cytological findings in specimens from cervix uteri: Secondary | ICD-10-CM | POA: Insufficient documentation

## 2010-07-29 LAB — CBC
HCT: 33.7 % — ABNORMAL LOW (ref 36.0–46.0)
MCH: 34 pg (ref 26.0–34.0)
MCV: 96.5 fL (ref 78.0–100.0)
Platelets: 173 10*3/uL (ref 150–400)
RBC: 3.49 MIL/uL — ABNORMAL LOW (ref 3.87–5.11)

## 2010-07-29 LAB — COMPREHENSIVE METABOLIC PANEL
Albumin: 3.3 g/dL — ABNORMAL LOW (ref 3.5–5.2)
BUN: 11 mg/dL (ref 6–23)
Chloride: 107 mEq/L (ref 96–112)
Creatinine, Ser: 0.7 mg/dL (ref 0.4–1.2)
GFR calc non Af Amer: >60 mL/min (ref >60)
Glucose, Bld: 112 mg/dL — ABNORMAL HIGH (ref 70–99)
Total Bilirubin: 0.9 mg/dL (ref 0.3–1.2)

## 2010-07-29 LAB — URINALYSIS, ROUTINE W REFLEX MICROSCOPIC
Ketones, ur: NEGATIVE mg/dL
Leukocytes, UA: NEGATIVE
Nitrite: NEGATIVE
Specific Gravity, Urine: 1.026 (ref 1.005–1.030)
pH: 6 (ref 5.0–8.0)

## 2010-07-29 LAB — URINE MICROSCOPIC-ADD ON

## 2010-07-29 LAB — LIPASE, BLOOD: Lipase: 63 U/L — ABNORMAL HIGH (ref 11–59)

## 2010-08-03 ENCOUNTER — Other Ambulatory Visit (INDEPENDENT_AMBULATORY_CARE_PROVIDER_SITE_OTHER): Payer: 59

## 2010-08-03 DIAGNOSIS — M810 Age-related osteoporosis without current pathological fracture: Secondary | ICD-10-CM

## 2010-08-04 ENCOUNTER — Other Ambulatory Visit: Payer: 59

## 2010-08-11 ENCOUNTER — Ambulatory Visit (HOSPITAL_COMMUNITY): Payer: 59 | Attending: Obstetrics and Gynecology

## 2010-08-11 DIAGNOSIS — M810 Age-related osteoporosis without current pathological fracture: Secondary | ICD-10-CM | POA: Insufficient documentation

## 2010-08-17 ENCOUNTER — Other Ambulatory Visit: Payer: Self-pay | Admitting: Family Medicine

## 2010-09-28 NOTE — Op Note (Signed)
Monique Quinn, Monique Quinn                 ACCOUNT NO.:  [id number]   MEDICAL RECORD NO.:  [id number]          PATIENT TYPE:  AMB   LOCATION:  SDS                          FACILITY:  [facility]   PHYSICIAN:  Thomas A. Cornett, M.D.DATE OF BIRTH:  [birth year]   DATE OF PROCEDURE:  11/15/2007  DATE OF DISCHARGE:                               OPERATIVE REPORT   REFERRING PHYSICIAN:  Hedwig Morton. Juanda Chance, MD   PREOPERATIVE DIAGNOSES:  1. Internal/external hemorrhoids.  2. Anal canal polyp.   POSTOPERATIVE DIAGNOSES:  1. Internal/external hemorrhoids.  2. Anal canal polyp.   PROCEDURE:  Exam under anesthesia with biopsy of anal canal polyp.   SURGEON:  Maisie Fus A. Cornett, MD.   ANESTHESIA:  LMA with 0.25% Sensorcaine local.   ESTIMATED BLOOD LOSS:  50 mL.   SPECIMEN:  Mass from anal canal anterior midline at the level of the  dentate line.  Biopsy in piecemeal fashion sent to pathology for  evaluation.   DRAINS:  None.   INDICATIONS FOR PROCEDURE:  The patient is a 55 year old female referred  by Lina Sar due to internal hemorrhoids and anal canal polyp and  history of rectal bleeding.  She was seen and felt that excision of the  anal polyp as well as potential hemorrhoidectomy would benefit her after  seeing her in the office.  She presents for that today.  Informed  consent was obtained.   DESCRIPTION OF PROCEDURE:  The patient was brought to the operating room  and placed supine.  After induction of LMA anesthesia, she was placed in  lithotomy.  Perianal region was prepped and draped in sterile fashion.  Digital examination was done.  The polyp in the anterior midline felt  hot and gritty.  I examined the introitus of her vagina at the same time  and did not see any evidence of any involvement of the vagina.  There  was firmness though at this level as well as that I could palpate  through the vaginal wall.  An anoscope was then placed.  Upon  examination, this appeared to be about 2-cm  somewhat dentulated anal  polyp.  I placed a stitch proximal to this using 3-0 Monocryl.  I then  used a tip to grab the mass, but it was quite friable and came out in a  piecemeal fashion.  I used a scalpel though to get as broader biopsies I  could from it.  There was still some residual grittyness to this area  after biopsy.  I went ahead and closed the area for hemostasis using 3-0  Monocryl.  She did have some what appeared to be grade 2 to mild grade 3  hemorrhoids involving the right lateral posterior region and left  anterior region.  Given the findings on examination, I felt that  cessation of the procedure to await the final pathology at this area was  warranted to exclude the anal canal carcinoma since it did have  characteristics at the base of this polyp that were worrisome for that.  The tissue was sent to pathology for further evaluation.  Hemostasis was  achieved.  2% lidocaine gel was then used.  Packing of Gelfoam and  Surgicel were placed in the anal canal.  All final counts of sponge,  needle, and instruments were found to be correct.  The patient was  awoke, taken in lithotomy, and taken to recovery in satisfactory  condition.      Thomas A. Cornett, M.D.  Electronically Signed     TAC/MEDQ  D:  11/15/2007  T:  11/15/2007  Job:  [id number]   cc:   Tinnie Gens A. Tawanna Cooler, MD

## 2010-09-28 NOTE — Op Note (Signed)
Monique Quinn, BLACKLOCK                 ACCOUNT NO.:  [id number]   MEDICAL RECORD NO.:  [id number]          PATIENT TYPE:  AMB   LOCATION:  NESC                         [facility]ACILITY:  [facility]   PHYSICIAN:  Daniel L. Gottsegen, M.D.DATE OF BIRTH:  [birth year]   DATE OF PROCEDURE:  12/18/2008  DATE OF DISCHARGE:                               OPERATIVE REPORT   PREOPERATIVE DIAGNOSIS:  Atypical glandular cells, probably of cervical  origin.   POSTOPERATIVE DIAGNOSIS:  Atypical glandular cells, probably of cervical  origin.   OPERATIONS:  Hysteroscopy with endometrial sampling followed by  conization of the cervix.   SURGEON:  Dr. Eda Paschal.   ANESTHESIA:  General.   INDICATIONS:  The patient is a 55 year old gravida 1, para 1-0-0-2 who  had presented to the office with an episode of postmenopausal bleeding.  Ultrasound appeared normal, except for an enlarged endometrial cavity.  An endometrial biopsy was done because of the enlarged endometrial  cavity and postmenopausal bleeding, and pathology report came back  atypical glandular cells.  Dr. Dierdre Searles was consulted who had read the biopsy.  She said she had used a special stain and she felt these were probably  cervical in origin.  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  She was brought back to the office.  Colposcopy  with biopsies were done.  No specific lesion was seen.  ECC was  negative.  This was also read by Dr. Dierdre Searles, and she was still concerned  that we had not found the source of these atypical cells.  As a result  of that, she now enters the hospital for further diagnostic procedures.   FINDINGS:  External is normal.  BUS is normal.  Vaginal is normal.  Cervix is clean.  Uterus is normal size and shape with less than first-  degree descensus.  Adnexa fails to reveal masses.  At the time of  hysteroscopy, the patient had a completely  normal endometrium,  consistent with an atrophic postmenopausal endometrium.  The top of the  fundus, tubal ostia, anterior and posterior walls of the uterus, lower  uterine segment and endocervical canal could all be visualized.  On the  right side near the cornual portion, there appeared to be some  impingement from an intramural myoma, but that was the only abnormal  finding.   PROCEDURE:  After adequate general anesthesia, the patient was placed in  the dorsal supine position and prepped and draped in the usual sterile  manner.  A single-tooth tenaculum was placed on the anterior lip of the  cervix.  The cervix was dilated to a #25 Pratt dilator and using a  diagnostic hysteroscope, 3% sorbitol to expand the intrauterine cavity  and a camera for magnification a hysteroscopy was performed.  There were  really no abnormal findings other than a possible intramural myoma on  the right.  Endometrial sampling was obtained from all quadrants.  After  this, a conization was done in  the following manner.  A 1:200,000  solution of epinephrine was injected around the cervix, a total of 8 mL  was utilized.  A 20 x 12 loop electrosurgical loop  instrument was used  and a very large conization was obtained.  This was sent to pathology  separately.  It was cut at 12 o'clock.  A 10 x 10 loop was then was used  to take an endocervical button to be sure that there was not disease  higher up, especially in a woman who had, had a cryo  surgical procedure, and this was also sent having been cut at 12  o'clock.  A ball tipped Bovie was then utilized to control bleeding.  At  the termination of the procedure there was no bleeding noted.  The  patient tolerated the procedure well and left the operating room in  satisfactory condition.      Daniel L. Eda Paschal, M.D.  Electronically Signed     DLG/MEDQ  D:  12/18/2008  T:  12/18/2008  Job:  [id number]

## 2010-09-28 NOTE — Assessment & Plan Note (Signed)
[facility]                         GASTROENTEROLOGY OFFICE NOTE   NAME:Sida, EMMOGENE SIMSON                        MRN:          [id number]  DATE:08/07/2007                            DOB:          1956-05-04    Ms. Morais is a 55 year old white female who is here today for evaluation  of constipation, symptomatic hemorrhoids, and rectal bleeding.  We saw  her in August of 2006 for evaluation of gastroesophageal reflux as well  as for a family history of colon cancer in her maternal aunt who had  advanced disease at the age of 24, as well as a family history of colon  polyps in her mother.  Her colonoscopy at that time was normal.  She  since then has developed secondary constipation.  She has a lot of  bloating and has to strain.  Her job is rather sedentary but she does  exercise about 3 times a week on cardio.  She eats a high-fiber diet.  Her ultrasound of the gallbladder in 2004 was normal.   MEDICATIONS:  Nexium 40 mg daily, Effexor XR 150 mg b.i.d., HCTZ 25 mg  p.o. daily, Allegra 180 p.o. daily, multivitamins, vitamin D, calcium  supplement, clindamycin 150 mg 4 a day from the dentist, doxycycline 100  mg p.o. daily, Provigil 200 mg p.o. daily, estradiol, Gabapentin 200 mg  daily, and Prometrium 200 mg 1 capsule for 12 days at the beginning of  each month.   PAST HISTORY:  Significant for fibromyalgia followed by Dr. Corliss Skains,  osteoarthritis of the hands, high blood pressure, depression, cancer of  the basal cell skin, migraines headaches.   OPERATIONS:  None.   FAMILY HISTORY:  Positive for colon cancer in maternal aunt, colon  polyps, in mother, uncle, father, and grandfather.  Diabetes in mother.  Heart disease in grandmother and mother.   SOCIAL HISTORY:  Married with 2 children.  She has a Naval architect.  Works in Equities trader.  She does not smoke and drinks  alcohol only  socially.   REVIEW OF SYSTEMS:  Positive for eye glasses,  ALLERGIES, changes in  urination.  Complains of sleeping problems, night sweats, pain with  periods, back pain, excessive urination, severe fatigue, heart murmur,  and muscle pains.   PHYSICAL EXAMINATION:  VITAL SIGNS:  Blood pressure 118/68, pulse 72,  weight 143 pounds.  The patient was somewhat __________ , mildly  depressed.  She was very cooperative.  SKIN:  Warm and dry.  HEENT:  Oral cavity normal.  NECK:  Supple.  No adenopathy.  LUNGS:  Clear to auscultation.  CARDIOVASCULAR:  Normal S1 and S2.  ABDOMEN:  Soft.  Not distended.  Normoactive bowel sounds.  Liver edge  at costal margin.  Her whole abdomen was normal with no palpable  tenderness.  RECTAL:  On rectal and anoscopic exam, she had thrombosed hemorrhoids  internally which were prolapsing.  It was rather tender.  The anal canal  was normal, and there were 2 first grade hemorrhoids which were not  thrombosed.  There was no stool  specimen to check for Hemoccult.  IMPRESSION:  1. A 55 year old white female with thrombosed internal hemorrhoids and      first grade internal hemorrhoids.  2. Functional constipation.  3. Polyps, with a family history of colon polyps and colon cancer,      with her last colonoscopy in August 2004.  4. Rectal bleeding likely related to these thrombosed hemorrhoids.  5. Gastroesophageal reflux disease.  Normal abdominal ultrasound 5      years ago.  Rule out functional dyspepsia.  Rule out Helicobacter      pylori.  Her upper endoscopy 5 years ago was negative for      Helicobacter pylori.   PLAN:  1. Begin MiraLax 17 grams 2-3 times a week.  2. Anusol suppositories daily.  3. Reglan 5 mg before meals.  4. Colonoscopy scheduled.  The patient is anxious to make sure there      are no colon polyps or obstructive lesion in her colon.     Hedwig Morton. Juanda Chance, MD  Electronically Signed    DMB/MedQ  DD: 08/07/2007  DT: 08/07/2007  Job #: [id number]   cc:   Tinnie Gens A. Tawanna Cooler, MD

## 2010-12-09 ENCOUNTER — Ambulatory Visit (INDEPENDENT_AMBULATORY_CARE_PROVIDER_SITE_OTHER): Payer: 59 | Admitting: Family Medicine

## 2010-12-09 ENCOUNTER — Encounter: Payer: Self-pay | Admitting: *Deleted

## 2010-12-09 ENCOUNTER — Encounter: Payer: Self-pay | Admitting: Family Medicine

## 2010-12-09 VITALS — Temp 98.5°F | Ht 67.0 in | Wt 134.0 lb

## 2010-12-09 DIAGNOSIS — N952 Postmenopausal atrophic vaginitis: Secondary | ICD-10-CM

## 2010-12-09 DIAGNOSIS — N63 Unspecified lump in unspecified breast: Secondary | ICD-10-CM

## 2010-12-09 MED ORDER — ESTROGENS, CONJUGATED 0.625 MG/GM VA CREA: 0.5000 g | TOPICAL_CREAM | Freq: Every day | VAGINAL | Status: DC

## 2010-12-09 NOTE — Progress Notes (Signed)
Subjective:    Patient ID: Monique Quinn, female    DOB: 21-Sep-1955, 55 y.o.   MRN: [id number]  HPITammy is a 55 year old, married female, nonsmoker, who comes in today for a general medical examination because of a history of rectal cancer, fatigue, osteoarthritis, chronic low back pain, peripheral neuropathy, secondary to chemo, history of reflux esophagitis, now with dysphasia and a sense of food getting stuck in her upper esophagus.  Monique Quinn finished her chemotherapy treatments for rectal cancer.  Last fall at [facility].  Her most recent scan shows no evidence of recurrence.  She's had a history of osteoarthritis however, with the chemotherapy.  It's gotten worse.  Also, she has severe low back pain.  She describes her low back pain is central constant.  A6 on a scale of one to 10.  She treats it with Motrin, 600 mg twice daily, gabapentin 300 mg 3 times a day, and Vicodin p.r.n..  Her pain is worse at night.  Also, because the chemo.  She now has osteoporosis and has been treated for this by her gynecologist with calcium and vitamin D, and an injectable bone building product.  She is also on Activella.  Because the chemo.  She has a neuropathy in her lower extremities and has difficulty walking and trouble with her balance.  Shells as alternating constipation and diarrhea since the, radiation and chemo.  She also has fatigue, and despite trying to exercise and walk on a daily basis.  Is not able to do much.  She bring much confined to home.  She also sees Dr. Nolen Mu for history of anxiety and depression.    Review of Systems    Other than above, negative, except for extreme vaginal dryness and dyspareunia Objective:   Physical Exam  Constitutional: She appears well-developed and well-nourished. No distress.  Neck: Normal range of motion. No JVD present. No tracheal deviation present. No thyromegaly present.  Cardiovascular: Normal rate, regular rhythm, normal  heart sounds and intact distal pulses.  Exam reveals no gallop and no friction rub.   No murmur heard. Pulmonary/Chest: Effort normal and breath sounds normal. No respiratory distress. She has no wheezes. She has no rales. She exhibits no tenderness.  Abdominal: Soft. Bowel sounds are normal. She exhibits no distension and no mass. There is no tenderness. There is no rebound and no guarding.  Genitourinary:       Vaginal dryness on inspectionalso bilateral fibrocystic changes  Musculoskeletal: Normal range of motion. She exhibits no edema and no tenderness.       Altered sensation lower extremities, consistent with neuropathy from the chemotherapy  Lymphadenopathy:    She has no cervical adenopathy.  Neurological: She is alert. She has normal reflexes. She displays normal reflexes. No cranial nerve deficit. She exhibits normal muscle tone. Coordination normal.  Skin: Skin is warm and dry. No rash noted. She is not diaphoretic. No erythema. No pallor.          Assessment & Plan:  History of metastatic rectal cancer, currently in remission.  Osteoporosis.  Continue treatment program.  Fatigue secondary to radiation chemo.  Dysphasia recommended.  GI consult.  Vaginal dryness recommended.  Premarin vaginal cream.  Fibrocystic changes recommend ultrasound.  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  Continue Motrin, gabapentin, and Vicodin p.r.n.  Because of her side effects from the radiation and the chemotherapy.  I do not recommend that she works full time.  I would recommend because  of all her problems that she be continued to disability.  She will also discuss this with her physician, Dr. Kathrynn Ducking at [facility]

## 2010-12-09 NOTE — Patient Instructions (Signed)
Let's add the small amounts of Premarin vaginal cream 3 times weekly for about two months and then once weekly.  We will get this set up in the breast Center for ultrasound to check the breast cysts.  Follow-up with Dr. Nolen Mu as outlined.  Follow-up with your oncologist at [facility] as outlined.  Return to see [place] in one year, sooner if any problems

## 2010-12-14 ENCOUNTER — Other Ambulatory Visit: Payer: Self-pay | Admitting: Family Medicine

## 2010-12-14 DIAGNOSIS — N6019 Diffuse cystic mastopathy of unspecified breast: Secondary | ICD-10-CM

## 2010-12-16 ENCOUNTER — Ambulatory Visit (INDEPENDENT_AMBULATORY_CARE_PROVIDER_SITE_OTHER): Payer: 59 | Admitting: Obstetrics and Gynecology

## 2010-12-16 DIAGNOSIS — R300 Dysuria: Secondary | ICD-10-CM

## 2010-12-16 DIAGNOSIS — M549 Dorsalgia, unspecified: Secondary | ICD-10-CM | POA: Insufficient documentation

## 2010-12-16 DIAGNOSIS — N390 Urinary tract infection, site not specified: Secondary | ICD-10-CM | POA: Insufficient documentation

## 2010-12-16 MED ORDER — NITROFURANTOIN MONOHYD MACRO 100 MG PO CAPS: 100.0000 mg | ORAL_CAPSULE | Freq: Two times a day (BID) | ORAL | Status: AC

## 2010-12-16 NOTE — Progress Notes (Signed)
The patient came to see me today for a variety of reasons. For the past several weeks she's been having urinary symptoms. She has difficulty urinating. She passes small amounts of urine. She is having some dysuria. Urinalysis today was completely normal.  Plan: Urine culture was obtained but because I think she may have urethritis she was treated with Macrobid twice a day with food for 7 days. We will let her know she needs a followup culture.  The more pressing issue with her however was that she has lost her disability benefits. Since I last seen her she's had  additional treatment for her annual cancer. She had several lesions in her lungsthat Dr. Brendia Sacks wanted to treat. She therefore is had additional chemotherapy. She now apparently has no more cancer to be treated. As a result of this they wanted to stop her disability benefits. She is however still unable to sit for any period of time. She has persistent pelvic pain when she does so. It is better for her to stand. She also now has a peripheral neuropathy that causes her a lot of pain in her lower extremitys. She also has a lot of back pain that has gotten worse. She used to see Dr. Corliss Skains for this. When  I had her show me where some of this pain was it is immediately adjacent to her anus.I told her today that she needs to be evaluated further for all this pain to get a proper diagnosis. I think she should see Dr. Corliss Skains and also gave her the name of someone in physical medicine and rehabilitation. I think she may also need an attorney to help her by appeal this as well. I told her I certainly could support her with the above and I've known her long enough to know that she is not a malinger. It is time for her to come back and see me and she will make an appointment to do so as well. Total time spent on the above to problems was 40 minutes.

## 2010-12-21 ENCOUNTER — Other Ambulatory Visit: Payer: 59

## 2010-12-23 ENCOUNTER — Institutional Professional Consult (permissible substitution): Payer: 59 | Admitting: Obstetrics and Gynecology

## 2010-12-28 ENCOUNTER — Ambulatory Visit
Admission: RE | Admit: 2010-12-28 | Discharge: 2010-12-28 | Disposition: A | Discharge status: Home / Self Care | Payer: 59 | Source: Ambulatory Visit | Attending: Family Medicine | Admitting: Family Medicine

## 2010-12-28 DIAGNOSIS — N6019 Diffuse cystic mastopathy of unspecified breast: Secondary | ICD-10-CM

## 2011-01-13 ENCOUNTER — Encounter: Payer: 59 | Admitting: Obstetrics and Gynecology

## 2011-01-20 ENCOUNTER — Ambulatory Visit (INDEPENDENT_AMBULATORY_CARE_PROVIDER_SITE_OTHER): Payer: 59 | Admitting: Obstetrics and Gynecology

## 2011-01-20 ENCOUNTER — Other Ambulatory Visit (HOSPITAL_COMMUNITY)
Admission: RE | Admit: 2011-01-20 | Discharge: 2011-01-20 | Disposition: A | Discharge status: Home / Self Care | Payer: 59 | Source: Ambulatory Visit | Attending: Obstetrics and Gynecology | Admitting: Obstetrics and Gynecology

## 2011-01-20 ENCOUNTER — Encounter: Payer: Self-pay | Admitting: Obstetrics and Gynecology

## 2011-01-20 VITALS — BP 122/78 | Ht 66.0 in | Wt 134.0 lb

## 2011-01-20 DIAGNOSIS — N951 Menopausal and female climacteric states: Secondary | ICD-10-CM

## 2011-01-20 DIAGNOSIS — Z01419 Encounter for gynecological examination (general) (routine) without abnormal findings: Secondary | ICD-10-CM | POA: Insufficient documentation

## 2011-01-20 DIAGNOSIS — Z13220 Encounter for screening for lipoid disorders: Secondary | ICD-10-CM

## 2011-01-20 DIAGNOSIS — Z780 Asymptomatic menopausal state: Secondary | ICD-10-CM

## 2011-01-20 DIAGNOSIS — N3281 Overactive bladder: Secondary | ICD-10-CM

## 2011-01-20 DIAGNOSIS — Z833 Family history of diabetes mellitus: Secondary | ICD-10-CM

## 2011-01-20 DIAGNOSIS — N318 Other neuromuscular dysfunction of bladder: Secondary | ICD-10-CM

## 2011-01-20 MED ORDER — ESTRADIOL-NORETHINDRONE ACET 1-0.5 MG PO TABS: 1.0000 | ORAL_TABLET | Freq: Every day | ORAL | Status: DC

## 2011-01-20 NOTE — Progress Notes (Signed)
The patient came to see me today for her annual GYN exam. She is up-to-date on her mammograms and bone densities. She has osteoporosis and got her first infusion with Reclast without any problems. We treated her with her an antibiotic for urethritis with a negative urine culture but she still has extreme frequency and urgency. She remains on HRT with excellent results and does not want to stop. She just initiated Premarin cream for vaginal dryness and burning a little bit on the first application. Both my patient and I feel that some of her dyspareunia is due to scarring from her anal cancer.  Physical examination: HEENT within normal limits. Neck: Thyroid not large. No masses. Supraclavicular nodes: not enlarged. Breasts: Examined in both sitting midline position. No skin changes and no masses. Abdomen: Soft no guarding rebound or masses or hernia. Pelvic: External: Within normal limits. BUS: Within normal limits. Vaginal:within normal limits. Good estrogen effect. No evidence of cystocele rectocele or enterocele. Cervix: clean. Uterus: Normal size and shape. Adnexa: No masses. Rectovaginal exam: Confirmatory and negative. Extremities: Within normal limits.  Assessment: Anal cancer. Osteoporosis. Menopausal symptoms. Atrophic vaginitis. Detrussor instability. Atypical glandular cells of her cervix( see above notes) .

## 2011-01-20 NOTE — Patient Instructions (Signed)
Call office in December to schedule IV reclast.

## 2011-02-10 LAB — COMPREHENSIVE METABOLIC PANEL
ALT: 18
AST: 19
Alkaline Phosphatase: 34 — ABNORMAL LOW
CO2: 26
GFR calc Af Amer: >60
Glucose, Bld: 90
Potassium: 3.5
Sodium: 136
Total Protein: 6.7

## 2011-02-10 LAB — DIFFERENTIAL
Basophils Relative: 1
Eosinophils Absolute: 0.2
Eosinophils Relative: 2
Lymphs Abs: 3
Monocytes Relative: 6
Neutrophils Relative %: 56

## 2011-02-10 LAB — CBC
Hemoglobin: 13.2
RBC: 4.39
RDW: 12.3

## 2011-02-11 ENCOUNTER — Telehealth: Payer: Self-pay | Admitting: *Deleted

## 2011-02-11 DIAGNOSIS — R300 Dysuria: Secondary | ICD-10-CM

## 2011-02-11 DIAGNOSIS — R3915 Urgency of urination: Secondary | ICD-10-CM

## 2011-02-11 DIAGNOSIS — R350 Frequency of micturition: Secondary | ICD-10-CM

## 2011-02-11 LAB — PROTIME-INR
INR: 1
Prothrombin Time: 13.3

## 2011-02-11 LAB — CBC
MCHC: 34.4
MCV: 87.4
Platelets: 325
RBC: 4.46
RDW: 12.4

## 2011-02-11 NOTE — Telephone Encounter (Signed)
Pt states Dr Reece Agar and her had spoke at her last OV about a referral to a urologist in [place] for her urinary issues. Patient requests this doctors information or if we need to make the appt for her.   Patient knows Dr Joanna Hews in the office til Tues 02/15/11 and wants to wait til then to get this information.

## 2011-02-12 NOTE — Telephone Encounter (Signed)
Was the issue urgency and frequency? If so I think she should see Dr. Logan Bores who is at [facility] but also comes to [facility] once a week. I think she can self refer through department of urology at [facility]. Not sure why she said [place]. Please let me know. thanks

## 2011-02-15 NOTE — Telephone Encounter (Signed)
Pt informed and yes now she remembers it is to Dr Logan Bores at [facility]. Pt ok with that referral. Problem is urgency and frequency with pain associated to it. When goes to the bathroom only a little comes out. Pt will be contacted with apt date and time.

## 2011-02-16 NOTE — Telephone Encounter (Signed)
Pt informed apt with Dr Logan Bores at [facility] 05/19/11 at 2pm. Pt knows that there is a cancellation list and that she is ok to call as to check for an earlier appt.. Records to be faxed. Kw

## 2011-02-17 ENCOUNTER — Other Ambulatory Visit: Payer: Self-pay | Admitting: *Deleted

## 2011-02-17 NOTE — Telephone Encounter (Signed)
Pt called for a 90 day RX for Enablex 7.5mg . She states its helping a little bit. I didn't see it in the dictation but did confirm with patient that the first RX was given at the last OV. Her referral Apt with Dr Logan Bores isnt for a couple more months. If you can sign the RX please. Thanks

## 2011-02-18 MED ORDER — DARIFENACIN HYDROBROMIDE ER 15 MG PO TB24: 15.0000 mg | ORAL_TABLET | Freq: Every day | ORAL | Status: DC

## 2011-02-18 NOTE — Telephone Encounter (Signed)
Please see Monique Quinn's note. If it is only helping a little we should increase her dose to Enablex  15 mg daily. Please discuss with the patient to make sure she's not having side effects on the 7.5 mg dose.

## 2011-02-18 NOTE — Telephone Encounter (Signed)
Spoke with patient, she is not having any side effects.  Ok with going to 15mg  daily.  Rx sent.

## 2011-02-18 NOTE — Telephone Encounter (Signed)
Addended by: Valeda Malm L on: 02/18/2011 08:56 AM   Modules accepted: Orders

## 2011-02-24 IMAGING — CR DG ABDOMEN ACUTE W/ 1V CHEST
3 series · 3 of 3 positions shown · non-contrast
Comparison: Chest x-ray 11/13/2007

CLINICAL DATA: Abdominal pain and nausea.

ACUTE ABDOMEN SERIES (ABDOMEN 2 VIEW & CHEST 1 VIEW)

[w chest pa]
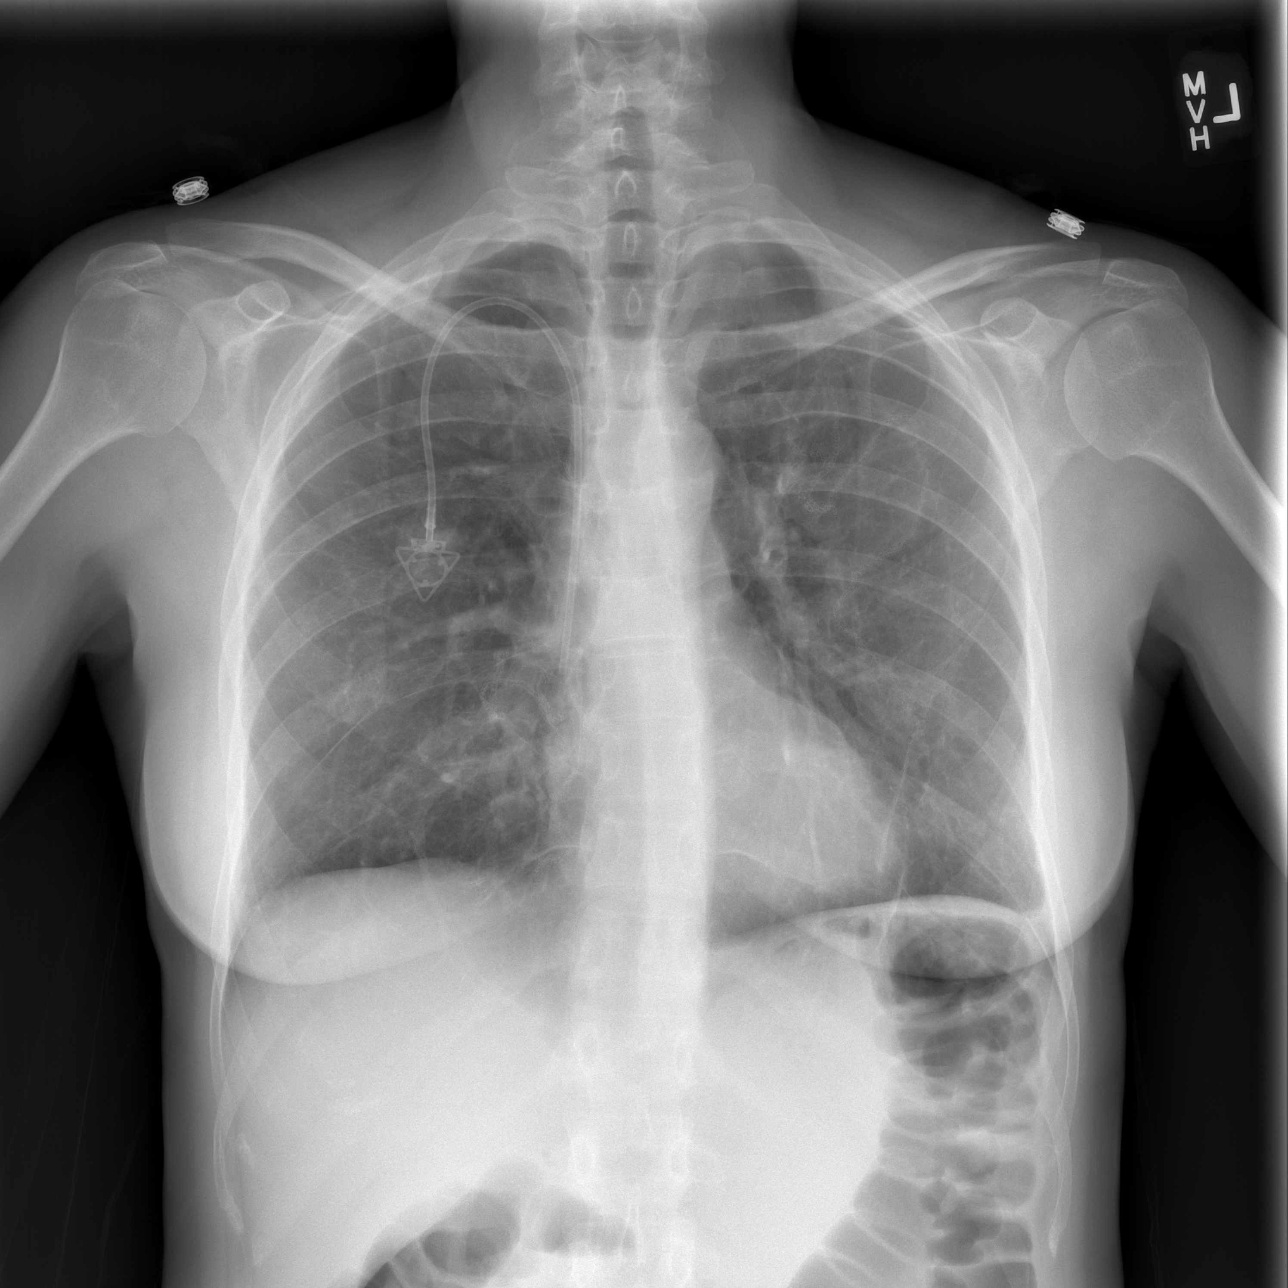

[w abdomen upright *]
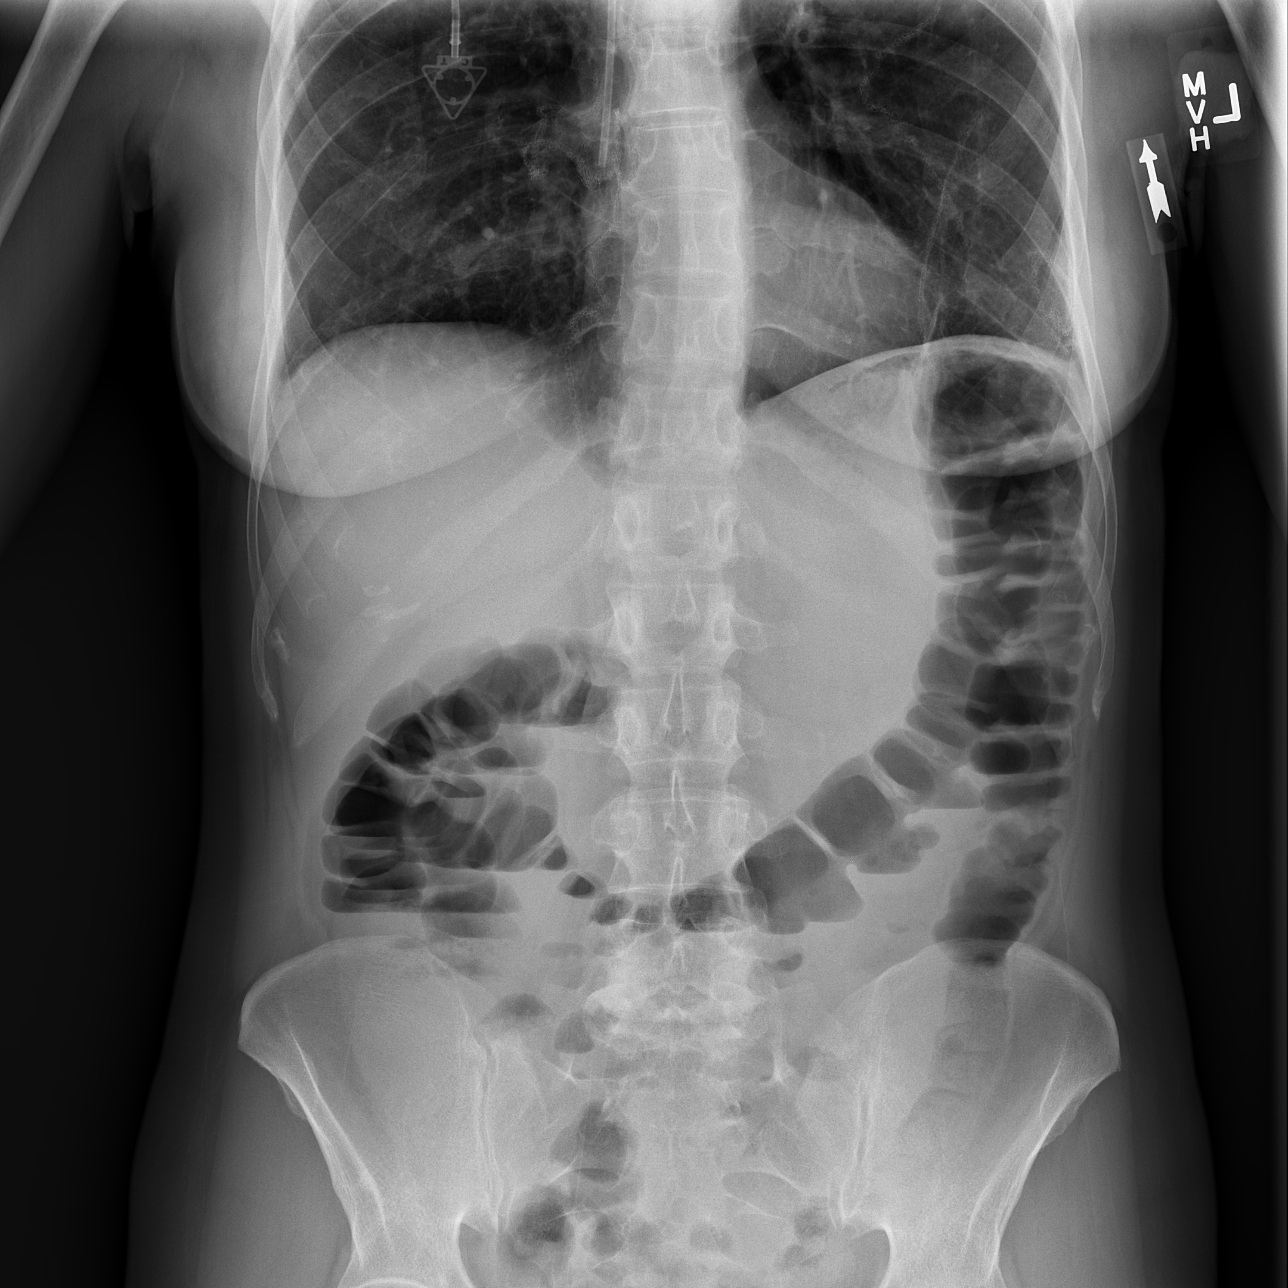

[t abdomen supine]
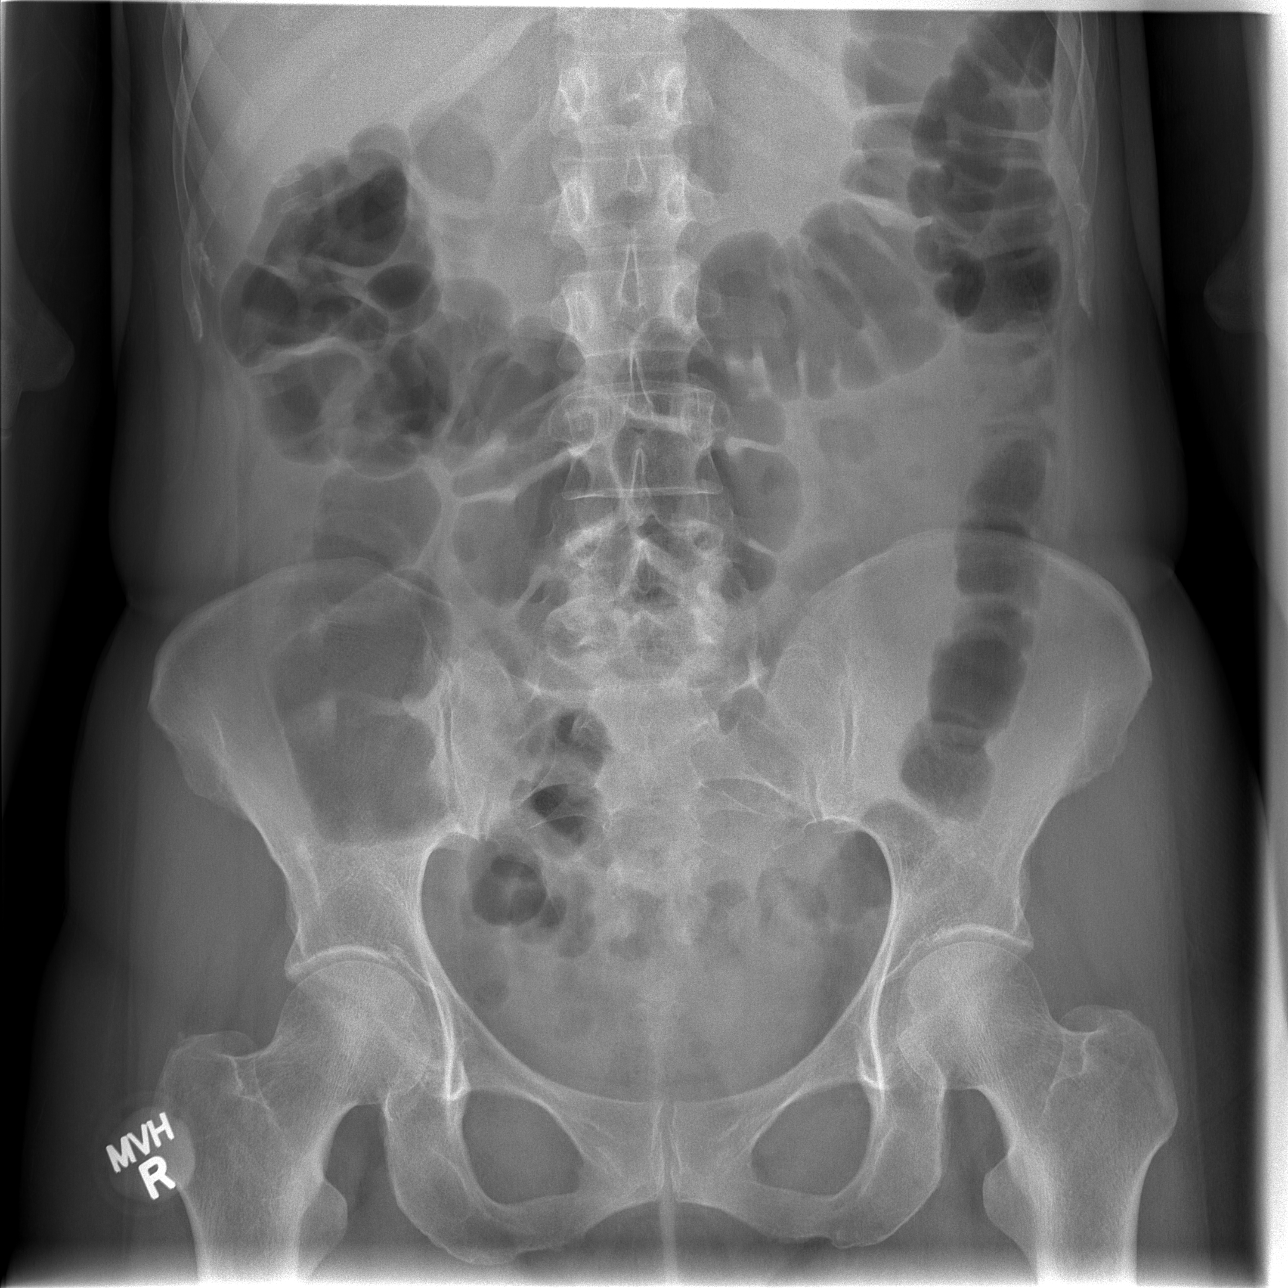

[3 of 3 positions shown; findings below may reference images not displayed]

FINDINGS: The right IJ Port-A-Cath is in good position.  No
complicating features.  There are surgical changes involving both
lungs along with underlying probable COPD changes.  No definite
acute pulmonary process.

Two views of the abdomen demonstrate air throughout the colon with
a few scattered air fluid levels.  No distended small bowel loops
to suggest obstruction.  The soft tissue shadows of the abdomen are
maintained.  No worrisome calcifications.  Bony structures are
unremarkable.
IMPRESSION: 1.  Surgical changes involving both lungs.  No acute pulmonary
findings.
2.  No plain film findings for small bowel obstruction or free air.

## 2011-04-19 ENCOUNTER — Encounter: Payer: Self-pay | Admitting: *Deleted

## 2011-04-21 ENCOUNTER — Other Ambulatory Visit: Payer: Self-pay | Admitting: Family Medicine

## 2011-04-25 ENCOUNTER — Encounter: Payer: Self-pay | Admitting: Internal Medicine

## 2011-04-25 ENCOUNTER — Ambulatory Visit (INDEPENDENT_AMBULATORY_CARE_PROVIDER_SITE_OTHER): Payer: 59 | Admitting: Internal Medicine

## 2011-04-25 DIAGNOSIS — Z85048 Personal history of other malignant neoplasm of rectum, rectosigmoid junction, and anus: Secondary | ICD-10-CM

## 2011-04-25 DIAGNOSIS — K219 Gastro-esophageal reflux disease without esophagitis: Secondary | ICD-10-CM

## 2011-04-25 MED ORDER — OMEPRAZOLE 20 MG PO CPDR: 20.0000 mg | DELAYED_RELEASE_CAPSULE | Freq: Every day | ORAL | Status: DC

## 2011-04-25 NOTE — Progress Notes (Signed)
TERRIANN DIFONZO 1955-06-26 MRN [id number]   History of Present Illness:  This is a 55 year old white female with two gastrointestinal issues; one is globus sensation in her throat. She has occasional cough and hoarseness. An upper endoscopy in 2004 showed grade 1 esophagitis. She was put on Nexium 40 mg daily and remained on it until recently. She was evaluated by an ENT specialist recently and started on Nexium 40 mg twice a day with complete resolution of her throat problems. She is hesitant to stay on high dose Nexium due to possible long-term side effects. She denies dysphagia. She does not smoke. 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 This was diagnosed and treated in April 2009. 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 She is status post radiation and chemotherapy completed in September 2009. Her PET scan was positive for left external iliac node which was treated with radiation. A repeat PET scan was negative. There is a family history of colon cancer in her maternal aunt. A colonoscopy in 2004 was normal. Her last colonoscopy in April 2009 showed a rectal lesion.   Past Medical History  Diagnosis Date  . Allergy   . Depression   . GERD (gastroesophageal reflux disease)   . Headache   . Hypertension   . Low back pain   . Fibromyalgia   . IBS (irritable bowel syndrome)   . Rectal cancer   . Osteopathia   . CIN II (cervical intraepithelial neoplasia II)   . STD (sexually transmitted disease)     HSV  . Menopausal symptoms   . TMJ (dislocation of temporomandibular joint)   . Hemorrhoids   . Esophagitis    Past Surgical History  Procedure Date  . Bunionectomy     bilateral  . Anal polyp   . Hysteroscopy   . Cervical cone biopsy 2010  . Lung surgery 2011    x 2  . Port a cath placement     reports that she has quit smoking. Her smoking use included Cigarettes. She has never used smokeless tobacco. She reports that she  drinks alcohol. She reports that she does not use illicit drugs. family history includes Colon cancer in her maternal aunt and maternal uncle; Colon polyps in her maternal uncle and mother; Diabetes in her mother; Heart disease in her brother, maternal grandmother, and mother; and Lung cancer in her mother. Allergies  Allergen Reactions  . Neosporin (Neomycin-Polymyx-Gramicid)         Review of Systems: Negative for dysphagia. Positive fold burning lump in her throat. Negative for chest pain.  The remainder of the 10 point ROS is negative except as outlined in H&P   Physical Exam: General appearance  Well developed, in no distress. Eyes- non icteric. HEENT nontraumatic, normocephalic. Mouth no lesions, tongue papillated, no cheilosis. Neck supple without adenopathy, thyroid not enlarged, no carotid bruits, no JVD. Lungs Clear to auscultation bilaterally. Cor normal S1, normal S2, regular rhythm, no murmur,  quiet precordium. Abdomen: Soft nontender with normoactive bowel sounds. Mild tenderness in left lower quadrant. No distention. Tattoo marks from radiation on left and right hip.  Rectal: normal rectal sphincter tone. Slightly tender anal canal. Hemoccult-negative stool. No mass. Extremities no pedal edema. Skin no lesions. Neurological alert and oriented x 3. Psychological normal mood and affect.  Assessment and Plan:  Problem #1 gastroesophageal reflux disease documented on a prior upper endoscopy in 2004. She has now had a recurrence of symptoms  which was previously controlled with high-dose Nexium. We need to rule out Barrett's esophagus. She will continue on Nexium 40 mg in the morning and will take Prilosec 20 mg at night. She may use additional antacids such as Gaviscon when necessary. She will elevate the head of the bed at night. We will schedule her for an upper endoscopy and biopsies at this time.  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 She is followed at [facility] every 6 months.  Problem #3 family history of colon cancer in some indirect relatives. She has a personal history of a diminutive polyp of the colon. She is up-to-date on her colonoscopy. Her next exam will be due in April 2014. We will put a recall in our system.    04/25/2011 Lina Sar

## 2011-04-25 NOTE — Patient Instructions (Signed)
You have been scheduled for an endoscopy. Please follow written instructions given to you at your visit today. We have sent the following medications to your pharmacy for you to pick up at your convenience: Prilosec 20 mg by mouth once every night in addition to your Nexium every morning. Please purchase Gaviscon over the counter and take as needed. Please elevate the head of the bed to help with reflux. CC: Dr Tawanna Cooler

## 2011-04-26 ENCOUNTER — Encounter: Payer: Self-pay | Admitting: Internal Medicine

## 2011-04-26 ENCOUNTER — Ambulatory Visit (AMBULATORY_SURGERY_CENTER): Payer: 59 | Admitting: Internal Medicine

## 2011-04-26 VITALS — BP 121/78 | HR 67 | Temp 97.8°F | Resp 16 | Ht 66.0 in | Wt 136.0 lb

## 2011-04-26 DIAGNOSIS — K219 Gastro-esophageal reflux disease without esophagitis: Secondary | ICD-10-CM

## 2011-04-26 DIAGNOSIS — K2940 Chronic atrophic gastritis without bleeding: Secondary | ICD-10-CM

## 2011-04-26 MED ORDER — SODIUM CHLORIDE 0.9 % IV SOLN: 500.0000 mL | INTRAVENOUS | Status: DC

## 2011-04-26 MED ORDER — FLUCONAZOLE 100 MG PO TABS: 100.0000 mg | ORAL_TABLET | Freq: Every day | ORAL | Status: AC

## 2011-04-26 NOTE — Op Note (Signed)
[facility] [address] [place], [place]  [address]  ENDOSCOPY PROCEDURE REPORT  PATIENT:  Monique Quinn, Monique Quinn  MR#:  [id number] BIRTHDATE:  1955/10/17, 55 yrs. old  GENDER:  female  ENDOSCOPIST:  Hedwig Morton. Juanda Chance, MD Referred by:  Eugenio Hoes Tawanna Cooler, M.D.  PROCEDURE DATE:  04/26/2011 PROCEDURE:  EGD with biopsy, 432[address] ASA CLASS:  Class II INDICATIONS:  globus, GERD symptoms improved on Nexiem EGD 10 years ago- esophagitis  MEDICATIONS:   These medications were titrated to patient response per physician's verbal order, Versed 6 mg, Fentanyl 50 mcg TOPICAL ANESTHETIC:  Cetacaine Spray  DESCRIPTION OF PROCEDURE:   After the risks benefits and alternatives of the procedure were thoroughly explained, informed consent was obtained.  The LB [id number]-[id number] endoscope was introduced through the mouth and advanced to the second portion of the duodenum, without limitations.  The instrument was slowly withdrawn as the mucosa was fully examined. <<PROCEDUREIMAGES>>  The upper, middle, and distal third of the esophagus were carefully inspected and no abnormalities were noted. The z-line was well seen at the GEJ. The endoscope was pushed into the fundus which was normal including a retroflexed view. The antrum,gastric body, first and second part of the duodenum were unremarkable. Cetacaine vc Candida esophagiti in the esophagus, no stricture or erosions With standard forceps, a biopsy was obtained and sent to pathology (see image1, image6, image7, image8, and image9). g-e junction  Otherwise the examination was normal (see image5, image3, and image2). several small fundic gland polyps Retroflexed views revealed no abnormalities.    The scope was then withdrawn from the patient and the procedure completed.  COMPLICATIONS:  None  ENDOSCOPIC IMPRESSION: 1) Normal EGD 2) Otherwise normal examination nothing to account for globus sensation RECOMMENDATIONS: 1) Await biopsy results 2)  Anti-reflux regimen to be follow Diflucan 100 mg, 33, 1 po qdx3 continue Nexiem 40 mg am and Prilosec 20 mg hs  REPEAT EXAM:  In 0 year(s) for.  ______________________________ Hedwig Morton. Juanda Chance, MD  CC:  n. eSIGNED:   Hedwig Morton. Brodie at 04/26/2011 02:07 PM  Sundra Aland, [phone]

## 2011-04-26 NOTE — Progress Notes (Signed)
Patient did not experience any of the following events: a burn prior to discharge; a fall within the facility; wrong site/side/patient/procedure/implant event; or a hospital transfer or hospital admission upon discharge from the facility. (G8907) Patient did not have preoperative order for IV antibiotic SSI prophylaxis. (G8918)  

## 2011-04-26 NOTE — Patient Instructions (Addendum)
Please refer to your blue and neon green sheets for instructions regarding diet and activity for the rest of today.  You may resume your medications as you would normally take them.   Prescription given for Diflucan.

## 2011-04-27 ENCOUNTER — Telehealth: Payer: Self-pay | Admitting: *Deleted

## 2011-04-27 NOTE — Telephone Encounter (Signed)

## 2011-05-02 ENCOUNTER — Encounter: Payer: Self-pay | Admitting: Internal Medicine

## 2011-05-02 ENCOUNTER — Other Ambulatory Visit: Payer: Self-pay | Admitting: *Deleted

## 2011-05-02 MED ORDER — OMEPRAZOLE 20 MG PO CPDR: 20.0000 mg | DELAYED_RELEASE_CAPSULE | Freq: Every day | ORAL | Status: DC

## 2011-05-05 ENCOUNTER — Telehealth: Payer: Self-pay | Admitting: *Deleted

## 2011-05-05 NOTE — Telephone Encounter (Signed)
FYI Pt wanted to let you know that she went to her doctor at [facility] and they also agreed that pt should proceed with hysterectomy as well. The pt will seen Dr. Chester Holstein GYN/Onclogist she has not set any surgery dates, but she did want to inform you with this news. She said any question feel free to call her.

## 2011-05-11 NOTE — Telephone Encounter (Signed)
Pt called medical records regarding the below note, Lm on pt vm to call back.

## 2011-05-26 ENCOUNTER — Telehealth: Payer: Self-pay | Admitting: Internal Medicine

## 2011-05-26 NOTE — Telephone Encounter (Signed)
Left a message for patient to call me. 

## 2011-05-30 NOTE — Telephone Encounter (Signed)
Spoke with patient and she states she is having some stomach cramping like IBS in past. Bowel movements are normal. Occasional constipation. She had Bentyl in past and would like an rx if possible. Please, advise

## 2011-05-30 NOTE — Telephone Encounter (Signed)
OK to send Bentyl 10mg , #30, 1 po ac prn, 1 refill

## 2011-05-31 MED ORDER — DICYCLOMINE HCL 10 MG PO CAPS: ORAL_CAPSULE | ORAL | Status: DC

## 2011-05-31 NOTE — Telephone Encounter (Signed)
Spoke with patient and rx sent to [facility].

## 2011-07-29 ENCOUNTER — Other Ambulatory Visit: Payer: Self-pay | Admitting: Medical Oncology

## 2011-07-29 ENCOUNTER — Ambulatory Visit (HOSPITAL_BASED_OUTPATIENT_CLINIC_OR_DEPARTMENT_OTHER): Payer: 59 | Admitting: Lab

## 2011-07-29 DIAGNOSIS — C210 Malignant neoplasm of anus, unspecified: Secondary | ICD-10-CM

## 2011-07-29 LAB — CBC WITH DIFFERENTIAL/PLATELET
BASO%: 0.4 % (ref 0.0–2.0)
EOS%: 3.8 % (ref 0.0–7.0)
Eosinophils Absolute: 0.1 10*3/uL (ref 0.0–0.5)
LYMPH%: 28.4 % (ref 14.0–49.7)
MCH: 31 pg (ref 25.1–34.0)
MCHC: 34.2 g/dL (ref 31.5–36.0)
MCV: 90.5 fL (ref 79.5–101.0)
MONO%: 5.3 % (ref 0.0–14.0)
NEUT#: 1.8 10*3/uL (ref 1.5–6.5)
Platelets: 187 10*3/uL (ref 145–400)
RBC: 3.42 10*6/uL — ABNORMAL LOW (ref 3.70–5.45)
RDW: 13.2 % (ref 11.2–14.5)

## 2011-07-29 LAB — COMPREHENSIVE METABOLIC PANEL
AST: 17 U/L (ref 0–37)
Albumin: 4 g/dL (ref 3.5–5.2)
Alkaline Phosphatase: 28 U/L — ABNORMAL LOW (ref 39–117)
Glucose, Bld: 83 mg/dL (ref 70–99)
Potassium: 4.3 mEq/L (ref 3.5–5.3)
Sodium: 140 mEq/L (ref 135–145)
Total Bilirubin: 0.3 mg/dL (ref 0.3–1.2)
Total Protein: 5.7 g/dL — ABNORMAL LOW (ref 6.0–8.3)

## 2011-08-09 ENCOUNTER — Telehealth: Payer: Self-pay | Admitting: *Deleted

## 2011-08-09 NOTE — Telephone Encounter (Signed)
Lm for patient to call.  Reclast due, need to know if patient still interested before processing benefits.

## 2011-08-11 NOTE — Telephone Encounter (Signed)
Patient does want to proceed with Reclast.  Will process benefits.  Labs were drawn in system on 07/29/11.  Will try to get processed and set up before 30 day mark.

## 2011-08-18 NOTE — Telephone Encounter (Signed)
Pt informed of Reclast appt 4/10 at 12 noon at [facility]. Order faxed. Advised ok to infuse through porta cath and remind ed to take tylenol ES starting day before infusion tid for 3 days. Instructions mailed to pt as well. kw

## 2011-08-22 ENCOUNTER — Other Ambulatory Visit (HOSPITAL_COMMUNITY): Payer: Self-pay | Admitting: *Deleted

## 2011-08-24 ENCOUNTER — Encounter (HOSPITAL_COMMUNITY)
Admission: RE | Admit: 2011-08-24 | Discharge: 2011-08-24 | Disposition: A | Discharge status: Home / Self Care | Payer: 59 | Source: Ambulatory Visit | Attending: Obstetrics and Gynecology | Admitting: Obstetrics and Gynecology

## 2011-08-24 DIAGNOSIS — M810 Age-related osteoporosis without current pathological fracture: Secondary | ICD-10-CM | POA: Insufficient documentation

## 2011-08-24 MED ORDER — ZOLEDRONIC ACID 5 MG/100ML IV SOLN
5.0000 mg | Freq: Once | INTRAVENOUS | Status: AC
  Administered 2011-08-24: 5 mg via INTRAVENOUS
  Filled 2011-08-24: qty 100

## 2011-08-24 MED ORDER — HEPARIN SOD (PORK) LOCK FLUSH 100 UNIT/ML IV SOLN
500.0000 [IU] | INTRAVENOUS | Status: AC | PRN
  Administered 2011-08-24: 500 [IU]
  Filled 2011-08-24: qty 5

## 2011-08-24 NOTE — Progress Notes (Signed)
Huber needle removed from right chest porta catheter per protocol -  Normal Saline flush and heparin as noted on MAR instilled.  Site unremarkable and catheter intact.

## 2011-08-24 NOTE — Progress Notes (Signed)
Pt has a right port a cath.  It was accessed in short stay B.  There was good blood return and normal saline is running at 20cc/hr

## 2011-08-25 ENCOUNTER — Other Ambulatory Visit: Payer: Self-pay | Admitting: Medical Oncology

## 2011-08-25 DIAGNOSIS — C20 Malignant neoplasm of rectum: Secondary | ICD-10-CM

## 2011-08-26 ENCOUNTER — Other Ambulatory Visit (HOSPITAL_BASED_OUTPATIENT_CLINIC_OR_DEPARTMENT_OTHER): Payer: 59 | Admitting: Lab

## 2011-08-26 DIAGNOSIS — C20 Malignant neoplasm of rectum: Secondary | ICD-10-CM

## 2011-08-26 LAB — COMPREHENSIVE METABOLIC PANEL
Alkaline Phosphatase: 43 U/L (ref 39–117)
CO2: 27 mEq/L (ref 19–32)
Creatinine, Ser: 0.8 mg/dL (ref 0.50–1.10)
Glucose, Bld: 121 mg/dL — ABNORMAL HIGH (ref 70–99)
Total Bilirubin: 0.2 mg/dL — ABNORMAL LOW (ref 0.3–1.2)

## 2011-08-26 LAB — CBC WITH DIFFERENTIAL/PLATELET
Basophils Absolute: 0 10*3/uL (ref 0.0–0.1)
EOS%: 1.6 % (ref 0.0–7.0)
Eosinophils Absolute: 0 10*3/uL (ref 0.0–0.5)
HGB: 11.1 g/dL — ABNORMAL LOW (ref 11.6–15.9)
LYMPH%: 41.3 % (ref 14.0–49.7)
MCH: 31.2 pg (ref 25.1–34.0)
MCV: 91.6 fL (ref 79.5–101.0)
MONO%: 22.7 % — ABNORMAL HIGH (ref 0.0–14.0)
NEUT#: 0.5 10*3/uL — ABNORMAL LOW (ref 1.5–6.5)
Platelets: 204 10*3/uL (ref 145–400)
RBC: 3.56 10*6/uL — ABNORMAL LOW (ref 3.70–5.45)

## 2011-08-30 ENCOUNTER — Telehealth: Payer: Self-pay | Admitting: Medical Oncology

## 2011-08-31 ENCOUNTER — Encounter: Payer: Self-pay | Admitting: Medical Oncology

## 2011-08-31 NOTE — Progress Notes (Signed)
Labs Faxed to Carvel Getting NP at [facility] [phone] or [phone]

## 2011-09-01 NOTE — Telephone Encounter (Signed)
I called Dr. Kathrynn Ducking office at [facility] to get a fax number to fax labs

## 2011-09-07 ENCOUNTER — Other Ambulatory Visit: Payer: Self-pay | Admitting: Obstetrics and Gynecology

## 2011-10-10 ENCOUNTER — Other Ambulatory Visit: Payer: Self-pay | Admitting: Family Medicine

## 2011-10-12 ENCOUNTER — Other Ambulatory Visit: Payer: Self-pay | Admitting: Medical Oncology

## 2011-10-12 DIAGNOSIS — C210 Malignant neoplasm of anus, unspecified: Secondary | ICD-10-CM

## 2011-10-12 DIAGNOSIS — C20 Malignant neoplasm of rectum: Secondary | ICD-10-CM

## 2011-10-13 ENCOUNTER — Other Ambulatory Visit (HOSPITAL_BASED_OUTPATIENT_CLINIC_OR_DEPARTMENT_OTHER): Payer: 59 | Admitting: Lab

## 2011-10-13 ENCOUNTER — Telehealth: Payer: Self-pay

## 2011-10-13 ENCOUNTER — Telehealth: Payer: Self-pay | Admitting: Oncology

## 2011-10-13 DIAGNOSIS — C20 Malignant neoplasm of rectum: Secondary | ICD-10-CM

## 2011-10-13 DIAGNOSIS — C210 Malignant neoplasm of anus, unspecified: Secondary | ICD-10-CM

## 2011-10-13 LAB — COMPREHENSIVE METABOLIC PANEL
Albumin: 4 g/dL (ref 3.5–5.2)
BUN: 10 mg/dL (ref 6–23)
Calcium: 8.8 mg/dL (ref 8.4–10.5)
Chloride: 101 mEq/L (ref 96–112)
Glucose, Bld: 79 mg/dL (ref 70–99)
Potassium: 3.6 mEq/L (ref 3.5–5.3)

## 2011-10-13 LAB — CBC WITH DIFFERENTIAL/PLATELET
Basophils Absolute: 0 10*3/uL (ref 0.0–0.1)
Eosinophils Absolute: 0.1 10*3/uL (ref 0.0–0.5)
HCT: 30.2 % — ABNORMAL LOW (ref 34.8–46.6)
HGB: 10.5 g/dL — ABNORMAL LOW (ref 11.6–15.9)
MCV: 89.9 fL (ref 79.5–101.0)
MONO%: 4.8 % (ref 0.0–14.0)
NEUT#: 1.2 10*3/uL — ABNORMAL LOW (ref 1.5–6.5)
RDW: 13.8 % (ref 11.2–14.5)
lymph#: 0.7 10*3/uL — ABNORMAL LOW (ref 0.9–3.3)

## 2011-10-13 NOTE — Telephone Encounter (Signed)
Per rb set up pt for lab 5.30,pt aware  aom

## 2011-10-13 NOTE — Telephone Encounter (Signed)
Faxed cbc to Carvel Getting NP at [facility]

## 2011-10-22 ENCOUNTER — Other Ambulatory Visit: Payer: Self-pay | Admitting: Obstetrics and Gynecology

## 2011-11-02 ENCOUNTER — Encounter: Payer: Self-pay | Admitting: *Deleted

## 2011-11-02 NOTE — Progress Notes (Signed)
[facility]  Clinical Social Work  Clinical Social Work met with patient in office today to complete healthcare advance directives. The patient completed the healthcare living will and healthcare power of attorney directives.  Monique Quinn designated her spouse, Monique Quinn, as her primary healthcare agent.  Please refer to directives located in Chart Review under the Media Tab.  CSW made copies for patient to share with her other healthcare providers.  Kathrin Penner, MSW, [facility] Clinical Social Worker [facility] [phone]

## 2011-12-25 ENCOUNTER — Other Ambulatory Visit: Payer: Self-pay | Admitting: Family Medicine

## 2011-12-30 ENCOUNTER — Other Ambulatory Visit: Payer: Self-pay | Admitting: Obstetrics and Gynecology

## 2012-01-04 ENCOUNTER — Other Ambulatory Visit: Payer: Self-pay | Admitting: Internal Medicine

## 2012-01-04 ENCOUNTER — Other Ambulatory Visit: Payer: Self-pay | Admitting: Family Medicine

## 2012-01-04 ENCOUNTER — Other Ambulatory Visit: Payer: Self-pay | Admitting: Obstetrics and Gynecology

## 2012-01-04 DIAGNOSIS — Z1231 Encounter for screening mammogram for malignant neoplasm of breast: Secondary | ICD-10-CM

## 2012-01-23 ENCOUNTER — Encounter: Payer: Self-pay | Admitting: Obstetrics and Gynecology

## 2012-01-23 ENCOUNTER — Ambulatory Visit (INDEPENDENT_AMBULATORY_CARE_PROVIDER_SITE_OTHER): Payer: Medicare Other | Admitting: Obstetrics and Gynecology

## 2012-01-23 ENCOUNTER — Other Ambulatory Visit (HOSPITAL_COMMUNITY)
Admission: RE | Admit: 2012-01-23 | Discharge: 2012-01-23 | Disposition: A | Discharge status: Home / Self Care | Payer: Medicare Other | Source: Ambulatory Visit | Attending: Obstetrics and Gynecology | Admitting: Obstetrics and Gynecology

## 2012-01-23 VITALS — BP 110/72 | Ht 66.0 in | Wt 133.0 lb

## 2012-01-23 DIAGNOSIS — G629 Polyneuropathy, unspecified: Secondary | ICD-10-CM | POA: Insufficient documentation

## 2012-01-23 DIAGNOSIS — Z1151 Encounter for screening for human papillomavirus (HPV): Secondary | ICD-10-CM | POA: Insufficient documentation

## 2012-01-23 DIAGNOSIS — Z124 Encounter for screening for malignant neoplasm of cervix: Secondary | ICD-10-CM

## 2012-01-23 DIAGNOSIS — N871 Moderate cervical dysplasia: Secondary | ICD-10-CM

## 2012-01-23 DIAGNOSIS — N644 Mastodynia: Secondary | ICD-10-CM

## 2012-01-23 DIAGNOSIS — M810 Age-related osteoporosis without current pathological fracture: Secondary | ICD-10-CM

## 2012-01-23 DIAGNOSIS — I341 Nonrheumatic mitral (valve) prolapse: Secondary | ICD-10-CM | POA: Insufficient documentation

## 2012-01-23 DIAGNOSIS — C210 Malignant neoplasm of anus, unspecified: Secondary | ICD-10-CM

## 2012-01-23 MED ORDER — ESTRADIOL-NORETHINDRONE ACET 1-0.5 MG PO TABS: 1.0000 | ORAL_TABLET | Freq: Every day | ORAL | Status: DC

## 2012-01-23 NOTE — Progress Notes (Signed)
Patient came back to see me today for further followup. The patient had been previously treated for high grade cervical dysplasia with cryosurgery in 1990. After this she had yearly Pap smears which showed no dysplasia. In 2010 as a result of an evaluation for postmenopausal bleeding endometrial biopsy was performed on 11/10/2008 with pathology revealing scanty atypical glandular cells. Immunochemical stain was performed and atypical glandular cells were strongly positive for P16. The patient underwent colposcopy with cervical biopsies which did not reveal dysplasia. ECC was negative. At that time there was still concern that the source of the atypical cells was not found. On 12/18/2008 she underwent hysteroscopy with endometrial sampling and LEEP. One area from the LEEP showed a rare foci of endocervical glandular atypia. Nuclear atypia did not meet criteria for endocervical adenocarcinoma in situ. The patient had negative margins. The patient had previously finished treatment for anal  carcinoma and therefore we were  reluctant to proceed with hysterectomy due to the above. She was seen by Dr. Leida Lauth  at [facility] oncology who just recommended close followup. She also got a second opinion at [facility] oncology which concurred and felt that the LEEP had excised all disease. Unfortunately since then her anal cancer has returned in the lung and she was eventually treated with chemotherapy on 2 separate treatment regimens. There is still a suggestion of metastatic disease in her lung but for the moment they are not recommending further treatment. Since her LEEP she has had a total of 6 normal Pap smears with the absence of high risk HPV on 2 occasions. She is scheduled for yearly mammogram. She is now received Reclast 2 years in a row for osteoporosis with no problems. She is due for followup bone density now. She has been on hormone replacement therapy for 5 years for severe vasomotor symptoms with the  permission of her oncologist. She has no withdrawal bleeding. She is having no pelvic pain. For the past month she has had persistent bilateral mastodynia.  ROS: 12 system review done. Pertinent positives above. Other positives include TMJ and osteoarthritis. She also has a history of migraines, lower back pain, mitral valve prolapse and hypertension and headaches.  Physical examination:Monique Quinn present. HEENT within normal limits. Neck: Thyroid not large. No masses. Supraclavicular nodes: not enlarged. Breasts: Examined in both sitting and lying  position. No skin changes and no masses. Abdomen: Soft no guarding rebound or masses or hernia. Pelvic: External: Within normal limits. BUS: Within normal limits. Vaginal:within normal limits. Good estrogen effect. No evidence of cystocele rectocele or enterocele. Cervix: clean. Uterus: Normal size and shape. Adnexa: No masses. Rectovaginal exam: Confirmatory and negative. Extremities: Within normal limits.  Assessment: #1. Anal cancer #2. Osteoporosis #3. Menopausal symptoms #4. Mastodynia #5. Endocervical glandular atypia.  Plan: Mammogram. Discussed higher risk of breast cancer with HRT. Patient would like to continue. Yearly prescription written. Bone density. If improvement will continue IV Reclast. Continue Paps with HPV testing every 6 months as per GYN oncology consult

## 2012-01-23 NOTE — Patient Instructions (Signed)
Schedule bone density.    

## 2012-01-24 ENCOUNTER — Ambulatory Visit
Admission: RE | Admit: 2012-01-24 | Discharge: 2012-01-24 | Disposition: A | Discharge status: Home / Self Care | Payer: 59 | Source: Ambulatory Visit | Attending: Obstetrics and Gynecology | Admitting: Obstetrics and Gynecology

## 2012-01-24 DIAGNOSIS — Z1231 Encounter for screening mammogram for malignant neoplasm of breast: Secondary | ICD-10-CM

## 2012-01-30 ENCOUNTER — Other Ambulatory Visit: Payer: Self-pay | Admitting: *Deleted

## 2012-01-30 MED ORDER — OMEPRAZOLE 20 MG PO CPDR: 20.0000 mg | DELAYED_RELEASE_CAPSULE | Freq: Every day | ORAL | Status: DC

## 2012-01-31 ENCOUNTER — Encounter: Payer: Self-pay | Admitting: Obstetrics and Gynecology

## 2012-02-28 ENCOUNTER — Other Ambulatory Visit: Payer: Self-pay | Admitting: Obstetrics and Gynecology

## 2012-02-28 DIAGNOSIS — M810 Age-related osteoporosis without current pathological fracture: Secondary | ICD-10-CM

## 2012-03-01 ENCOUNTER — Ambulatory Visit (INDEPENDENT_AMBULATORY_CARE_PROVIDER_SITE_OTHER): Payer: Medicare Other

## 2012-03-01 DIAGNOSIS — M810 Age-related osteoporosis without current pathological fracture: Secondary | ICD-10-CM

## 2012-07-24 ENCOUNTER — Other Ambulatory Visit: Payer: Self-pay

## 2012-07-24 MED ORDER — ESTRADIOL-NORETHINDRONE ACET 1-0.5 MG PO TABS: 1.0000 | ORAL_TABLET | Freq: Every day | ORAL | Status: DC

## 2012-08-08 ENCOUNTER — Encounter: Payer: Self-pay | Admitting: Internal Medicine

## 2012-08-21 ENCOUNTER — Telehealth: Payer: Self-pay | Admitting: *Deleted

## 2012-08-21 DIAGNOSIS — M810 Age-related osteoporosis without current pathological fracture: Secondary | ICD-10-CM

## 2012-08-21 NOTE — Telephone Encounter (Signed)
Pt ready to proceed with Reclast. Pt will come for labs. Calcium and creatnine. I will schedule Reclast after receiving nml results. Pt will see TF when time for RGCE or sooner if needed.

## 2012-08-22 ENCOUNTER — Other Ambulatory Visit: Payer: Self-pay

## 2012-08-24 ENCOUNTER — Other Ambulatory Visit: Payer: Medicare Other

## 2012-08-24 DIAGNOSIS — M810 Age-related osteoporosis without current pathological fracture: Secondary | ICD-10-CM

## 2012-08-24 LAB — CREATININE, SERUM: Creat: 0.82 mg/dL (ref 0.50–1.10)

## 2012-09-04 NOTE — Telephone Encounter (Signed)
Pt was called to to set up time and date for Reclast. She said she is not sure if she wants to proceed right now. She will call me back. I reminded her labs are good for 30 days. She is aware and will let me know. KW

## 2013-01-23 ENCOUNTER — Encounter: Payer: Medicare Other | Admitting: Gynecology

## 2013-03-04 ENCOUNTER — Other Ambulatory Visit: Payer: Self-pay

## 2013-03-04 DIAGNOSIS — Z1231 Encounter for screening mammogram for malignant neoplasm of breast: Secondary | ICD-10-CM

## 2013-04-03 ENCOUNTER — Ambulatory Visit: Payer: Medicare Other

## 2013-04-04 ENCOUNTER — Encounter: Payer: Medicare Other | Admitting: Gynecology

## 2013-04-23 ENCOUNTER — Encounter: Payer: Self-pay | Admitting: Gynecology

## 2013-04-23 ENCOUNTER — Ambulatory Visit (INDEPENDENT_AMBULATORY_CARE_PROVIDER_SITE_OTHER): Payer: Medicare Other | Admitting: Gynecology

## 2013-04-23 VITALS — BP 120/76 | Ht 67.0 in | Wt 142.0 lb

## 2013-04-23 DIAGNOSIS — N952 Postmenopausal atrophic vaginitis: Secondary | ICD-10-CM

## 2013-04-23 DIAGNOSIS — R87619 Unspecified abnormal cytological findings in specimens from cervix uteri: Secondary | ICD-10-CM

## 2013-04-23 DIAGNOSIS — M810 Age-related osteoporosis without current pathological fracture: Secondary | ICD-10-CM

## 2013-04-23 NOTE — Progress Notes (Signed)
Monique Quinn October 29, 1955 [id number]        57 y.o.  G1P1002 for followup exam.  Former patient of Dr. Eda Paschal. Several issues noted below.  Past medical history,surgical history, problem list, medications, allergies, family history and social history were all reviewed and documented in the EPIC chart.  ROS:  Performed and pertinent positives and negatives are included in the history, assessment and plan .  Exam: Kim assistant Filed Vitals:   04/23/13 1159  BP: 120/76  Height: 5\' 7"  (1.702 m)  Weight: 142 lb (64.411 kg)   General appearance  Normal Skin grossly normal Head/Neck normal with no cervical or supraclavicular adenopathy thyroid normal Lungs  clear Cardiac RR, without RMG Abdominal  soft, nontender, without masses, organomegaly or hernia Breasts  examined lying and sitting without masses, retractions, discharge or axillary adenopathy. Pelvic  Ext/BUS/vagina  Normal with atrophic changes  Cervix  Normal with atrophic changes  Uterus  anteverted, normal size, shape and contour, midline and mobile nontender   Adnexa  Without masses or tenderness    Anus and perineum  Normal   Rectovaginal  Normal sphincter tone without palpated masses or tenderness.    Assessment/Plan:  57 y.o. G33P1002 female for followup exam.   1. Postmenopausal/atrophic genital changes. Without significant hot flashes night sweats vaginal dryness or dyspareunia. No vaginal bleeding. Had been on HRT in the past but discontinued. Plan expectant management. Patient knows to report any vaginal bleeding. 2. Metastatic anal cancer now on experimental protocols. Actively being followed at [facility]. 3. Pap smear 2014 at [facility] by her history. History of HGSIL with cryosurgery 1990. Followup atypical glandular cells ultimately leading to hysteroscopy D&C and LEEP. There was a rare focus of endocervical atypia with margins clear. Has been followed expectantly with normal Pap smears. Will continue with annual Pap  smears. 4. Osteoporosis. DEXA 2011 with T score -2.8. Had been on Reclast x2 years with followup DEXA 2013 improved with T score -2.1. Stopped Reclast last year at her choice. Will repeat DEXA next year at two-year interval. Going to have a vitamin D level checked at [facility] when she has her next blood draw. 5. Mammography scheduled next week. SBE monthly reviewed. 6. Health maintenance. Routine blood work done through her other physicians. The blood work done today. Followup one year, sooner as needed.   Note: This document was prepared with digital dictation and possible smart phrase technology. Any transcriptional errors that result from this process are unintentional.   Dara Lords MD, 12:50 PM 04/23/2013

## 2013-04-23 NOTE — Patient Instructions (Signed)
Follow up in one year, sooner as needed. 

## 2013-04-30 ENCOUNTER — Encounter: Payer: Self-pay | Admitting: Obstetrics and Gynecology

## 2013-05-02 ENCOUNTER — Ambulatory Visit
Admission: RE | Admit: 2013-05-02 | Discharge: 2013-05-02 | Disposition: A | Discharge status: Home / Self Care | Payer: Medicare Other | Source: Ambulatory Visit

## 2013-05-02 DIAGNOSIS — Z1231 Encounter for screening mammogram for malignant neoplasm of breast: Secondary | ICD-10-CM

## 2013-07-17 ENCOUNTER — Telehealth: Payer: Self-pay | Admitting: Family Medicine

## 2013-07-17 NOTE — Telephone Encounter (Signed)
Patient Information:  Caller Name: Tammy  Phone: [phone]  Patient: Monique Quinn, Monique Quinn  Gender: Female  DOB: 11-25-1955  Age: 58 Years  PCP: Stevie Kern [facility])  Pregnant: No  Office Follow Up:  Does the office need to follow up with this patient?: Yes  Instructions For The Office: Advised caller to keep phone nearby. She understands s/sx that would require her to call 911/ED.  PLEASE CONTACT PATIENT REGARDING S/SX DUE TO TREATMENT AND CHEST PAIN /SOB SYMPTOMS.  Under going clinical trial for anal cancer -mets to lungs.  RN Note:  Advised caller to keep phone nearby. She understands s/sx that would require 911/ED.  PLEASE CONTACT PATIENT REGARDING S/SX DUE TO TREATMENT AND CHEST PAIN /SOB SYMPTOMS  Symptoms  Reason For Call & Symptoms: Patient states she has Anal cancer metatised to her Lungs.  She is currently on a clinical trial- Lapatinib 250mg  x6 daily=1500mg  daily . Receiving treatments at [facility]. side effect of the Lapatinib can be heart palpatations heart issues.  She states chest pain with movement and  when taking deep breath and when lying still.  Feels like she is having to "take deeper breaths".  Lasting seconds. Described as "mild pain".  Located on Back under shoulder blade and left axillary .  Skin felt "burning" last night.   Described as Constant for three weeks but worse this last week.  She spoke with [facility] and they wanted her to follow up with PCP.  Slight cough occasional productive clear  , runny nose, afebrile.  HX DVT in arm from picc Line - 2009. Occasinal feels heart beating irregular but not now.  Reviewed Health History In EMR: Yes  Reviewed Medications In EMR: Yes  Reviewed Allergies In EMR: Yes  Reviewed Surgeries / Procedures: Yes  Date of Onset of Symptoms: 06/26/2013  Treatments Tried: Allergy pill , Ibuprofen , Hydromorphone  Treatments Tried Worked: Yes OB / GYN:  LMP: Unknown  Guideline(s) Used:  Chest Pain  Disposition Per Guideline:   Go to  ED Now  Reason For Disposition Reached:   History of prior "blood clot" in leg or lungs (i.e., deep vein thrombosis, pulmonary embolism)  Advice Given:  Call Back If:  Severe chest pain  Constant chest pain lasting longer than 5 minutes  Difficulty breathing  You become worse.  RN Overrode Recommendation:  Go To ED  Advised caller to keep phone nearby. She understands s/sx that would require 911/ED.  PLEASE CONTACT PATIENT REGARDING S/SX DUE TO TREATMENT AND CHEST PAIN /SOB SYMPTOMS

## 2013-07-17 NOTE — Telephone Encounter (Signed)
Called pt no answer, left detailed message need to call 911 now and go to ED due to symptoms.

## 2013-07-17 NOTE — Telephone Encounter (Signed)
Pt advised to go to ed by [facility], pt has refused and pt tells me she is not going to go to the ED.  Advised pt to go to UC, pt has refused, stating she would prefer to wait see Dr Sherren Mocha on thurs. Advised pt that dr todd has gone for the day, and would not be available until thurs am to call her back. Pt verbalized understanding and still continues to want to wait for Dr Sherren Mocha.  pt would like an ekg on Thurs.

## 2013-07-18 ENCOUNTER — Encounter: Payer: Self-pay | Admitting: Family Medicine

## 2013-07-18 ENCOUNTER — Ambulatory Visit (INDEPENDENT_AMBULATORY_CARE_PROVIDER_SITE_OTHER)
Admission: RE | Admit: 2013-07-18 | Discharge: 2013-07-18 | Disposition: A | Discharge status: Home / Self Care | Payer: Medicare Other | Source: Ambulatory Visit | Attending: Family Medicine | Admitting: Family Medicine

## 2013-07-18 ENCOUNTER — Ambulatory Visit (INDEPENDENT_AMBULATORY_CARE_PROVIDER_SITE_OTHER): Payer: Medicare Other | Admitting: Family Medicine

## 2013-07-18 VITALS — BP 120/76 | HR 73 | Temp 97.7°F | Resp 18 | Ht 67.0 in | Wt 146.0 lb

## 2013-07-18 DIAGNOSIS — R079 Chest pain, unspecified: Secondary | ICD-10-CM

## 2013-07-18 NOTE — Progress Notes (Signed)
   Subjective:    Patient ID: Monique Quinn, female    DOB: [birth year], 58 y.o.   MRN: [id number]  HPI Monique Quinn is a 58 year old married female nonsmoker who comes in today for evaluation of chest pain  She's had a history of rectal cancer. She's been treated at [facility]. She now has lesions in her chest. 2011 she had 13 nodules removed. She has a CT scan at [facility] about a month ago of her abdomen pelvis and chest. They have her on an experimental protocol with the hopes of putting her on immunotherapy. For the last 2 weeks she's had intermittent chest pain. Sometimes his right side sometimes left side sometimes is midsternal. She describes the pain as sometimes sharp sometimes dull increases with inspiration comes and goes sometimes last for a few seconds sometimes a few minutes sometimes a couple hours. Does not radiate she has no fever cough etc. The experimental medication she is on can cause cardiac problems. She called her contact person at [facility] who wanted her checked today   Review of Systems    negative Objective:   Physical Exam  Well-developed well-nourished female no acute distress  The lungs were clear to auscultation except for mild wheezing symmetrically......... no previous history of asthma  Cardiac exam normal  No palpable chest wall tenderness although today she feels that over the left lateral ninth 10th ribs it the anterior axillary line      Assessment & Plan:  Chest wall pain with wheezing plan stat chest x-ray

## 2013-07-18 NOTE — Patient Instructions (Addendum)
Go to the radiology office now  I will call you the report  Although the only chest x-ray have to compare with his 2009. She did have some nodules back then. The question is whether these are new and what significance they have I will defer to her oncologist at [facility]. I faxed her oncologist a copy of the x-ray report. She does not have any pericardial effusions pulmonary effusions pneumonia etc.  I discussed the findings with Monique Quinn by phone........ I do not think she has an acute process that needs to be gout with today. However I feel like she needs to be evaluated soon. Her next appointment is in 6 days at [facility]. I recommended she followup with them if she feels that is something she can do however if she has an acute problem I would be happy to see her again here in [place]

## 2013-07-18 NOTE — Telephone Encounter (Signed)
Spoke with patient.  Please put her on the schedule today at 1.

## 2013-07-18 NOTE — Progress Notes (Signed)
Pre-visit discussion using our clinic review tool. No additional management support is needed unless otherwise documented below in the visit note.  

## 2013-08-28 ENCOUNTER — Encounter: Payer: Self-pay | Admitting: Internal Medicine

## 2014-02-12 ENCOUNTER — Other Ambulatory Visit: Payer: Self-pay | Admitting: Gynecology

## 2014-02-12 DIAGNOSIS — M810 Age-related osteoporosis without current pathological fracture: Secondary | ICD-10-CM

## 2014-03-17 ENCOUNTER — Encounter: Payer: Self-pay | Admitting: Family Medicine

## 2014-04-22 ENCOUNTER — Ambulatory Visit (INDEPENDENT_AMBULATORY_CARE_PROVIDER_SITE_OTHER): Payer: Medicare Other

## 2014-04-22 DIAGNOSIS — M810 Age-related osteoporosis without current pathological fracture: Secondary | ICD-10-CM

## 2014-04-23 ENCOUNTER — Telehealth: Payer: Self-pay | Admitting: Gynecology

## 2014-04-23 NOTE — Telephone Encounter (Signed)
Tell patient bone density looks stable and I would stay off of Reclast. Repeat bone density in 2 years.

## 2014-04-23 NOTE — Telephone Encounter (Signed)
Pt informed with the below note. 

## 2014-04-28 ENCOUNTER — Other Ambulatory Visit: Payer: Self-pay

## 2014-04-28 DIAGNOSIS — Z1231 Encounter for screening mammogram for malignant neoplasm of breast: Secondary | ICD-10-CM

## 2014-05-15 ENCOUNTER — Ambulatory Visit
Admission: RE | Admit: 2014-05-15 | Discharge: 2014-05-15 | Disposition: A | Discharge status: Home / Self Care | Payer: Medicare Other | Source: Ambulatory Visit

## 2014-05-15 DIAGNOSIS — Z1231 Encounter for screening mammogram for malignant neoplasm of breast: Secondary | ICD-10-CM

## 2014-08-27 IMAGING — CR DG CHEST 2V
2 series · 2 of 2 positions shown · non-contrast
Comparison: CT BIOPSY dated 12/07/2007;

CLINICAL DATA: Chest pain, wheezing.

EXAM:
CHEST  2 VIEW

[view not recorded (1 of 2)]
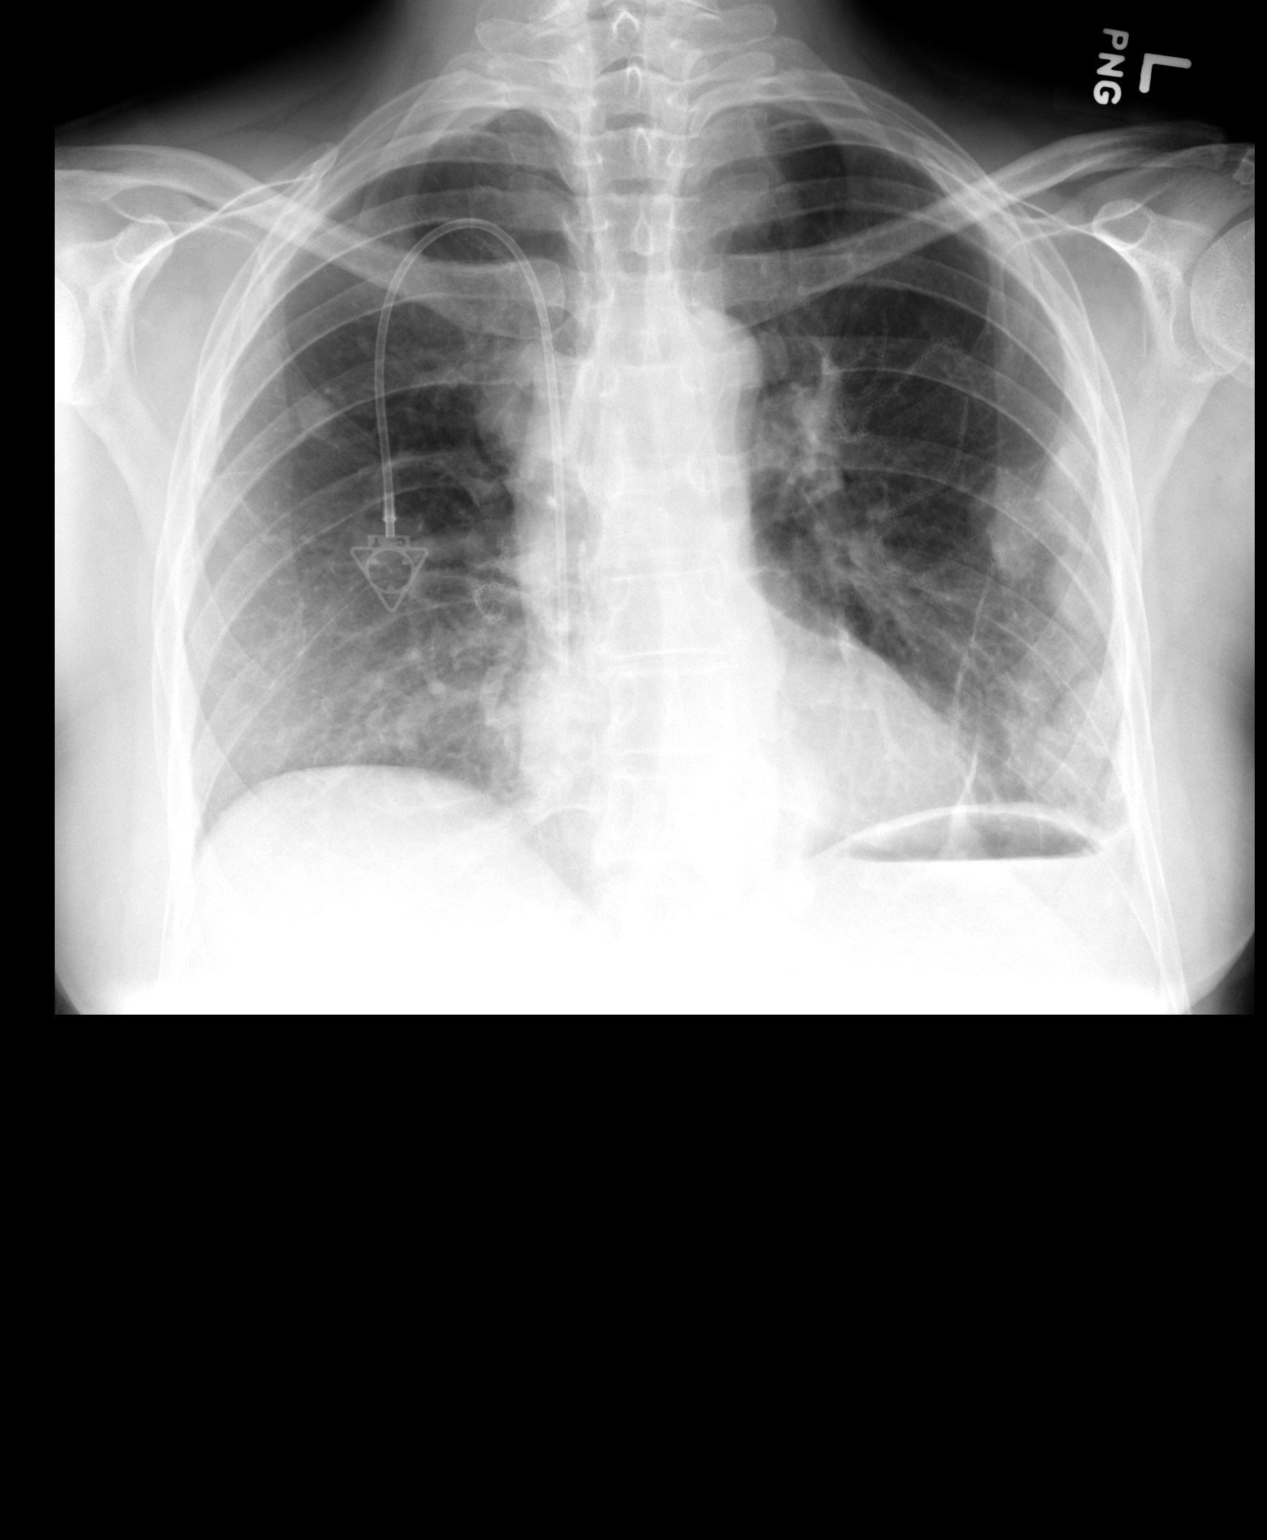

[view not recorded (2 of 2)]
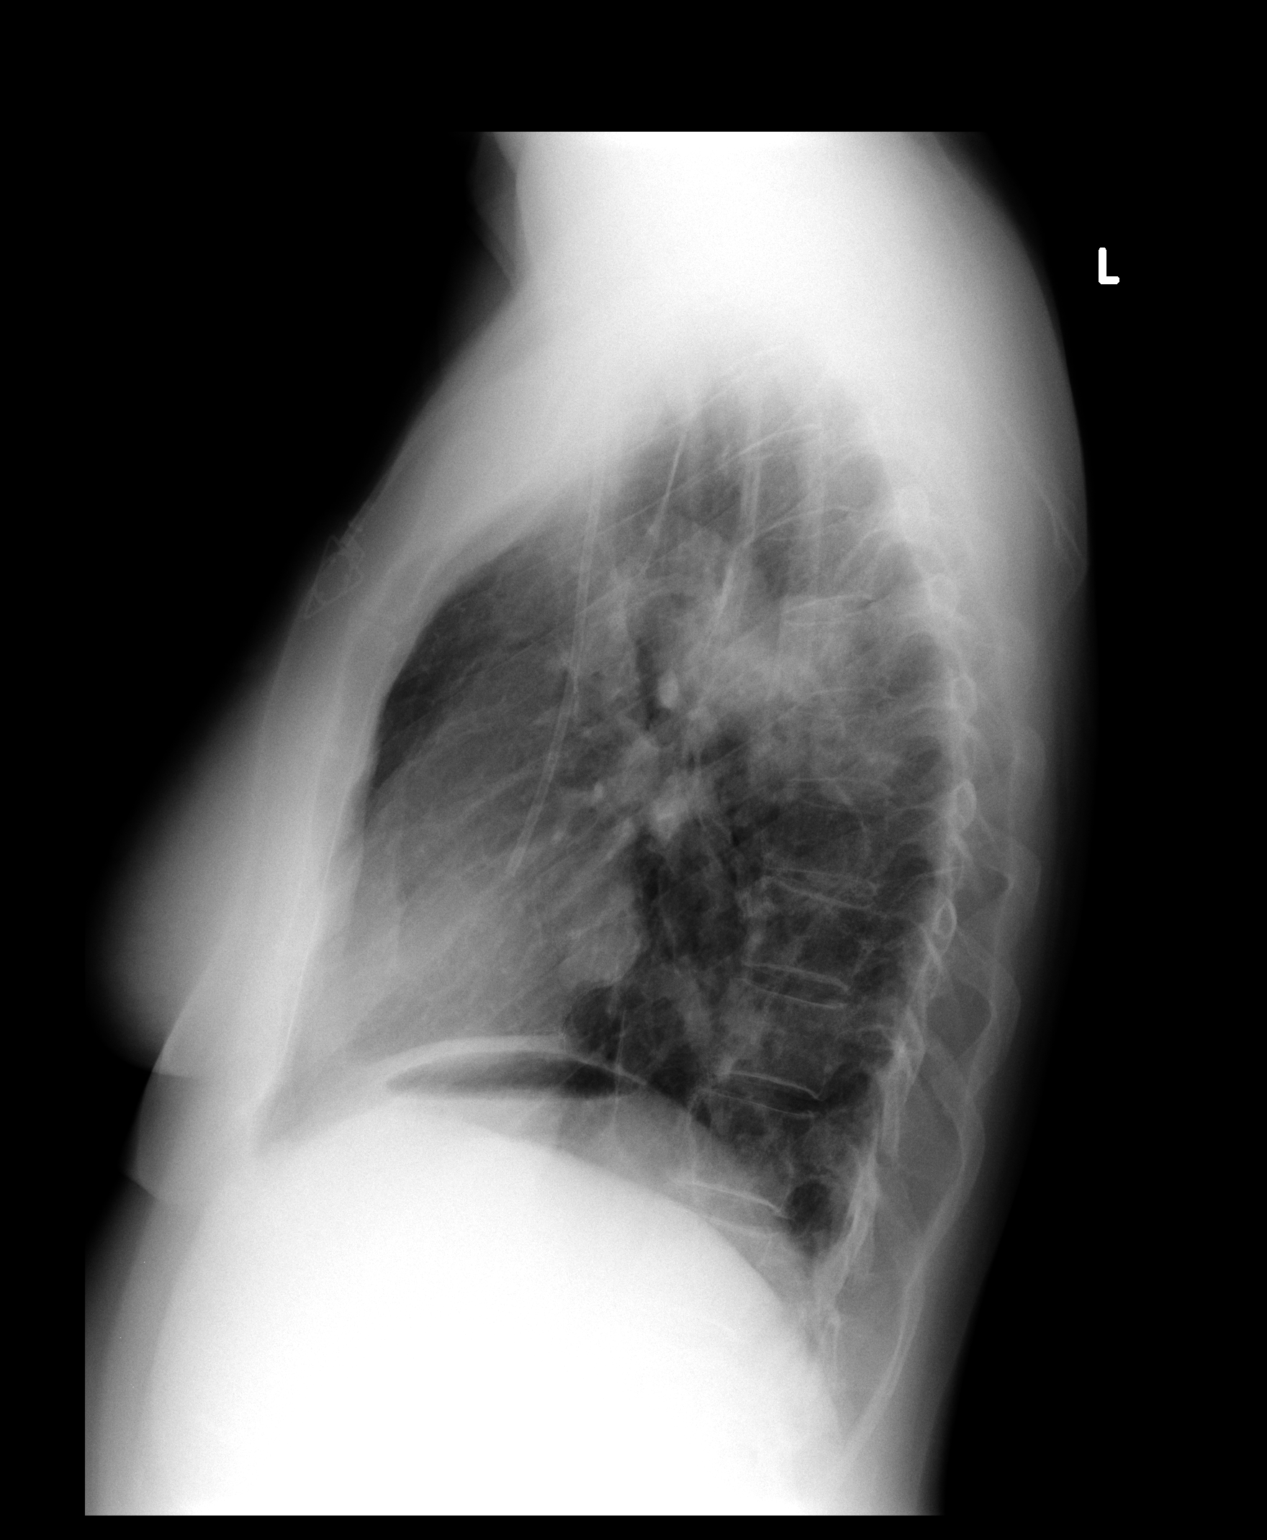

[2 of 2 positions shown; findings below may reference images not displayed]

NM PET IMAGE SKULL BASE TO
THIGH dated 12/04/2007; DG CHEST 2 VIEW dated 11/13/2007
FINDINGS: The lung volumes. A right-sided porta catheter is appreciated with
tip projecting in the region of the superior vena cava right atrial
junction. Sutures are identified within the left hemi thorax
extending from the hilar region to the left lung base consistent
with prior partial resection. Vertically oriented linear areas of
scarring project within the left lung base. Rounded and nodular
areas of increased density projects along the periphery of the left
hemi thorax. A nodular focus projects within the right upper lobe.
The area within the right upper lobe region measures 1.2 cm. These
areas on the left range in size from 2-3 cm. No focal infiltrates
appreciated. The osseous structures demonstrate no evidence of acute
abnormalities. Cardiac silhouette within normal limits. An area of
nodular density projects in the suprahilar region on the right.
Postsurgical changes also appreciated within the right hilar region.
IMPRESSION: Confluent nodular and small masslike densities within the peripheral
left hemi thorax and small nodular density within the right upper
lobe region. These findings are concerning for areas of soft tissue
masses and nodules degenerative patient's history and further
evaluation with contrasted chest CT is recommended. Postsurgical
changes identified within the left hemi thorax. There also findings
concerning for nodular density in the suprahilar region on the
right. This may represent an area of adenopathy.

## 2015-04-16 DEATH — deceased
